# Patient Record
Sex: Female | Born: 1935 | Race: White | Hispanic: No | Smoking: Never smoker
Health system: Southern US, Community
[De-identification: ages and names within clinical notes are randomized; demographics above are authoritative.]

## PROBLEM LIST (undated history)

## (undated) DIAGNOSIS — T4145XA Adverse effect of unspecified anesthetic, initial encounter: Secondary | ICD-10-CM

## (undated) DIAGNOSIS — N3090 Cystitis, unspecified without hematuria: Secondary | ICD-10-CM

## (undated) DIAGNOSIS — T8859XA Other complications of anesthesia, initial encounter: Secondary | ICD-10-CM

## (undated) DIAGNOSIS — I10 Essential (primary) hypertension: Secondary | ICD-10-CM

## (undated) DIAGNOSIS — J189 Pneumonia, unspecified organism: Secondary | ICD-10-CM

## (undated) HISTORY — PX: BREAST BIOPSY: SHX20

## (undated) HISTORY — PX: ABDOMINAL HYSTERECTOMY: SHX81

## (undated) HISTORY — PX: TONSILLECTOMY: SUR1361

## (undated) HISTORY — PX: APPENDECTOMY: SHX54

---

## 2017-07-09 ENCOUNTER — Encounter (HOSPITAL_COMMUNITY): Payer: Self-pay

## 2017-07-09 ENCOUNTER — Emergency Department (HOSPITAL_COMMUNITY): Payer: Medicare Other

## 2017-07-09 ENCOUNTER — Other Ambulatory Visit: Payer: Self-pay

## 2017-07-09 ENCOUNTER — Inpatient Hospital Stay (HOSPITAL_COMMUNITY)
Admission: EM | Admit: 2017-07-09 | Discharge: 2017-07-19 | DRG: 956 | Disposition: A | Discharge status: Skilled Nursing Facility | Payer: Medicare Other | Attending: General Surgery | Admitting: General Surgery

## 2017-07-09 DIAGNOSIS — R079 Chest pain, unspecified: Secondary | ICD-10-CM

## 2017-07-09 DIAGNOSIS — I2699 Other pulmonary embolism without acute cor pulmonale: Secondary | ICD-10-CM | POA: Diagnosis not present

## 2017-07-09 DIAGNOSIS — S065X9A Traumatic subdural hemorrhage with loss of consciousness of unspecified duration, initial encounter: Principal | ICD-10-CM | POA: Diagnosis present

## 2017-07-09 DIAGNOSIS — Z9181 History of falling: Secondary | ICD-10-CM

## 2017-07-09 DIAGNOSIS — I10 Essential (primary) hypertension: Secondary | ICD-10-CM | POA: Diagnosis present

## 2017-07-09 DIAGNOSIS — K5900 Constipation, unspecified: Secondary | ICD-10-CM | POA: Diagnosis present

## 2017-07-09 DIAGNOSIS — D62 Acute posthemorrhagic anemia: Secondary | ICD-10-CM | POA: Diagnosis not present

## 2017-07-09 DIAGNOSIS — R296 Repeated falls: Secondary | ICD-10-CM | POA: Diagnosis present

## 2017-07-09 DIAGNOSIS — S72141A Displaced intertrochanteric fracture of right femur, initial encounter for closed fracture: Secondary | ICD-10-CM | POA: Diagnosis not present

## 2017-07-09 DIAGNOSIS — M25571 Pain in right ankle and joints of right foot: Secondary | ICD-10-CM | POA: Diagnosis not present

## 2017-07-09 DIAGNOSIS — G8929 Other chronic pain: Secondary | ICD-10-CM | POA: Diagnosis present

## 2017-07-09 DIAGNOSIS — R0602 Shortness of breath: Secondary | ICD-10-CM

## 2017-07-09 DIAGNOSIS — M810 Age-related osteoporosis without current pathological fracture: Secondary | ICD-10-CM | POA: Diagnosis not present

## 2017-07-09 DIAGNOSIS — Z79899 Other long term (current) drug therapy: Secondary | ICD-10-CM | POA: Diagnosis not present

## 2017-07-09 DIAGNOSIS — S42221A 2-part displaced fracture of surgical neck of right humerus, initial encounter for closed fracture: Secondary | ICD-10-CM | POA: Diagnosis not present

## 2017-07-09 DIAGNOSIS — S72001A Fracture of unspecified part of neck of right femur, initial encounter for closed fracture: Secondary | ICD-10-CM

## 2017-07-09 DIAGNOSIS — W1830XA Fall on same level, unspecified, initial encounter: Secondary | ICD-10-CM | POA: Diagnosis present

## 2017-07-09 DIAGNOSIS — W19XXXA Unspecified fall, initial encounter: Secondary | ICD-10-CM | POA: Diagnosis present

## 2017-07-09 DIAGNOSIS — Y92009 Unspecified place in unspecified non-institutional (private) residence as the place of occurrence of the external cause: Secondary | ICD-10-CM

## 2017-07-09 DIAGNOSIS — M4856XA Collapsed vertebra, not elsewhere classified, lumbar region, initial encounter for fracture: Secondary | ICD-10-CM | POA: Diagnosis not present

## 2017-07-09 DIAGNOSIS — M4854XA Collapsed vertebra, not elsewhere classified, thoracic region, initial encounter for fracture: Secondary | ICD-10-CM | POA: Diagnosis not present

## 2017-07-09 DIAGNOSIS — S42211A Unspecified displaced fracture of surgical neck of right humerus, initial encounter for closed fracture: Secondary | ICD-10-CM

## 2017-07-09 DIAGNOSIS — F0390 Unspecified dementia without behavioral disturbance: Secondary | ICD-10-CM | POA: Diagnosis present

## 2017-07-09 DIAGNOSIS — W19XXXS Unspecified fall, sequela: Secondary | ICD-10-CM

## 2017-07-09 DIAGNOSIS — Z419 Encounter for procedure for purposes other than remedying health state, unspecified: Secondary | ICD-10-CM

## 2017-07-09 DIAGNOSIS — M25561 Pain in right knee: Secondary | ICD-10-CM | POA: Diagnosis not present

## 2017-07-09 DIAGNOSIS — R40241 Glasgow coma scale score 13-15, unspecified time: Secondary | ICD-10-CM | POA: Diagnosis present

## 2017-07-09 DIAGNOSIS — S065XAA Traumatic subdural hemorrhage with loss of consciousness status unknown, initial encounter: Secondary | ICD-10-CM

## 2017-07-09 DIAGNOSIS — M25461 Effusion, right knee: Secondary | ICD-10-CM

## 2017-07-09 DIAGNOSIS — E440 Moderate protein-calorie malnutrition: Secondary | ICD-10-CM

## 2017-07-09 HISTORY — DX: Adverse effect of unspecified anesthetic, initial encounter: T41.45XA

## 2017-07-09 HISTORY — DX: Essential (primary) hypertension: I10

## 2017-07-09 HISTORY — DX: Pneumonia, unspecified organism: J18.9

## 2017-07-09 HISTORY — DX: Other complications of anesthesia, initial encounter: T88.59XA

## 2017-07-09 HISTORY — DX: Cystitis, unspecified without hematuria: N30.90

## 2017-07-09 LAB — I-STAT CHEM 8, ED
BUN: 19 mg/dL (ref 6–20)
CALCIUM ION: 1.12 mmol/L — AB (ref 1.15–1.40)
CHLORIDE: 102 mmol/L (ref 101–111)
Creatinine, Ser: 0.5 mg/dL (ref 0.44–1.00)
Glucose, Bld: 156 mg/dL — ABNORMAL HIGH (ref 65–99)
HCT: 41 % (ref 36.0–46.0)
Hemoglobin: 13.9 g/dL (ref 12.0–15.0)
POTASSIUM: 3.2 mmol/L — AB (ref 3.5–5.1)
SODIUM: 140 mmol/L (ref 135–145)
TCO2: 27 mmol/L (ref 22–32)

## 2017-07-09 LAB — CBC WITH DIFFERENTIAL/PLATELET
Basophils Absolute: 0 10*3/uL (ref 0.0–0.1)
Basophils Relative: 0 %
Eosinophils Absolute: 0.1 10*3/uL (ref 0.0–0.7)
Eosinophils Relative: 1 %
HCT: 39.5 % (ref 36.0–46.0)
HEMOGLOBIN: 13.1 g/dL (ref 12.0–15.0)
LYMPHS ABS: 1.7 10*3/uL (ref 0.7–4.0)
Lymphocytes Relative: 16 %
MCH: 30.3 pg (ref 26.0–34.0)
MCHC: 33.2 g/dL (ref 30.0–36.0)
MCV: 91.2 fL (ref 78.0–100.0)
Monocytes Absolute: 0.9 10*3/uL (ref 0.1–1.0)
Monocytes Relative: 8 %
NEUTROS ABS: 7.8 10*3/uL — AB (ref 1.7–7.7)
NEUTROS PCT: 75 %
Platelets: 333 10*3/uL (ref 150–400)
RBC: 4.33 MIL/uL (ref 3.87–5.11)
RDW: 13.6 % (ref 11.5–15.5)
WBC: 10.5 10*3/uL (ref 4.0–10.5)

## 2017-07-09 LAB — COMPREHENSIVE METABOLIC PANEL
ALK PHOS: 73 U/L (ref 38–126)
ALT: 18 U/L (ref 14–54)
AST: 22 U/L (ref 15–41)
Albumin: 3.5 g/dL (ref 3.5–5.0)
Anion gap: 10 (ref 5–15)
BUN: 17 mg/dL (ref 6–20)
CALCIUM: 8.7 mg/dL — AB (ref 8.9–10.3)
CO2: 25 mmol/L (ref 22–32)
CREATININE: 0.53 mg/dL (ref 0.44–1.00)
Chloride: 102 mmol/L (ref 101–111)
Glucose, Bld: 156 mg/dL — ABNORMAL HIGH (ref 65–99)
Potassium: 3.1 mmol/L — ABNORMAL LOW (ref 3.5–5.1)
Sodium: 137 mmol/L (ref 135–145)
Total Bilirubin: 0.6 mg/dL (ref 0.3–1.2)
Total Protein: 6.4 g/dL — ABNORMAL LOW (ref 6.5–8.1)

## 2017-07-09 LAB — CK: CK TOTAL: 47 U/L (ref 38–234)

## 2017-07-09 LAB — ABO/RH: ABO/RH(D): A POS

## 2017-07-09 LAB — URINALYSIS, ROUTINE W REFLEX MICROSCOPIC
BILIRUBIN URINE: NEGATIVE
GLUCOSE, UA: NEGATIVE mg/dL
KETONES UR: NEGATIVE mg/dL
LEUKOCYTES UA: NEGATIVE
NITRITE: NEGATIVE
PH: 7 (ref 5.0–8.0)
PROTEIN: NEGATIVE mg/dL
Specific Gravity, Urine: 1.014 (ref 1.005–1.030)

## 2017-07-09 LAB — PROTIME-INR
INR: 0.96
PROTHROMBIN TIME: 12.6 s (ref 11.4–15.2)

## 2017-07-09 LAB — TYPE AND SCREEN
ABO/RH(D): A POS
Antibody Screen: NEGATIVE

## 2017-07-09 LAB — I-STAT CG4 LACTIC ACID, ED: Lactic Acid, Venous: 1.52 mmol/L (ref 0.5–1.9)

## 2017-07-09 LAB — CDS SEROLOGY

## 2017-07-09 LAB — ETHANOL

## 2017-07-09 MED ORDER — MAGNESIUM HYDROXIDE 400 MG/5ML PO SUSP
30.0000 mL | Freq: Once | ORAL | Status: DC
  Filled 2017-07-09: qty 30

## 2017-07-09 MED ORDER — AMLODIPINE BESYLATE 5 MG PO TABS
7.5000 mg | ORAL_TABLET | Freq: Every day | ORAL | Status: DC
  Administered 2017-07-10 – 2017-07-19 (×9): 7.5 mg via ORAL
  Filled 2017-07-09 (×9): qty 1

## 2017-07-09 MED ORDER — DOCUSATE SODIUM 100 MG PO CAPS
200.0000 mg | ORAL_CAPSULE | Freq: Two times a day (BID) | ORAL | Status: DC
  Administered 2017-07-09 – 2017-07-19 (×19): 200 mg via ORAL
  Filled 2017-07-09 (×21): qty 2

## 2017-07-09 MED ORDER — ONDANSETRON HCL 4 MG/2ML IJ SOLN
4.0000 mg | Freq: Once | INTRAMUSCULAR | Status: AC
  Administered 2017-07-09: 4 mg via INTRAVENOUS
  Filled 2017-07-09: qty 2

## 2017-07-09 MED ORDER — ACETAMINOPHEN 325 MG PO TABS
650.0000 mg | ORAL_TABLET | ORAL | Status: DC | PRN
  Administered 2017-07-09 – 2017-07-11 (×3): 650 mg via ORAL
  Filled 2017-07-09 (×3): qty 2

## 2017-07-09 MED ORDER — POLYETHYLENE GLYCOL 3350 17 G PO PACK
17.0000 g | PACK | Freq: Every day | ORAL | Status: DC
  Administered 2017-07-10 – 2017-07-19 (×9): 17 g via ORAL
  Filled 2017-07-09 (×9): qty 1

## 2017-07-09 MED ORDER — FLEET ENEMA 7-19 GM/118ML RE ENEM
1.0000 | ENEMA | Freq: Once | RECTAL | Status: DC
  Filled 2017-07-09: qty 1

## 2017-07-09 MED ORDER — HYDROMORPHONE HCL 1 MG/ML IJ SOLN
0.3000 mg | INTRAMUSCULAR | Status: DC | PRN
  Administered 2017-07-09 – 2017-07-12 (×15): 0.3 mg via INTRAVENOUS
  Filled 2017-07-09 (×15): qty 1

## 2017-07-09 MED ORDER — LACTATED RINGERS IV SOLN
INTRAVENOUS | Status: DC
  Administered 2017-07-09 – 2017-07-13 (×8): via INTRAVENOUS
  Administered 2017-07-14 (×2): 1000 mL via INTRAVENOUS
  Administered 2017-07-15 – 2017-07-17 (×3): via INTRAVENOUS

## 2017-07-09 MED ORDER — TRAMADOL HCL 50 MG PO TABS
50.0000 mg | ORAL_TABLET | Freq: Two times a day (BID) | ORAL | Status: DC
  Administered 2017-07-09 – 2017-07-18 (×19): 50 mg via ORAL
  Filled 2017-07-09 (×19): qty 1

## 2017-07-09 MED ORDER — FENTANYL CITRATE (PF) 100 MCG/2ML IJ SOLN
50.0000 ug | Freq: Once | INTRAMUSCULAR | Status: AC
  Administered 2017-07-09: 50 ug via INTRAVENOUS
  Filled 2017-07-09: qty 2

## 2017-07-09 MED ORDER — ONDANSETRON HCL 4 MG/2ML IJ SOLN
4.0000 mg | Freq: Four times a day (QID) | INTRAMUSCULAR | Status: DC | PRN
  Administered 2017-07-10 – 2017-07-14 (×3): 4 mg via INTRAVENOUS
  Filled 2017-07-09 (×11): qty 2

## 2017-07-09 MED ORDER — IOPAMIDOL (ISOVUE-300) INJECTION 61%
INTRAVENOUS | Status: AC
  Administered 2017-07-09: 50 mL
  Filled 2017-07-09: qty 100

## 2017-07-09 MED ORDER — ONDANSETRON HCL 4 MG/2ML IJ SOLN
4.0000 mg | Freq: Once | INTRAMUSCULAR | Status: AC
Start: 2017-07-09 — End: 2017-07-09
  Administered 2017-07-09: 4 mg via INTRAVENOUS
  Filled 2017-07-09 (×2): qty 2

## 2017-07-09 MED ORDER — ONDANSETRON 4 MG PO TBDP
4.0000 mg | ORAL_TABLET | Freq: Four times a day (QID) | ORAL | Status: DC | PRN
Start: 2017-07-09 — End: 2017-07-19
  Administered 2017-07-15 – 2017-07-17 (×2): 4 mg via ORAL
  Filled 2017-07-09 (×3): qty 1

## 2017-07-09 NOTE — Consult Note (Addendum)
Patient ID: Misty [name] MRN: [id number] DOB/AGE: 80/08/1934 80 y.o.  Admit date: 07/09/2017  Admission Diagnoses:  Active Problems:   Fall from standing   HPI: Patient being seen at the request of trauma service for right proximal humerus fracture and right hip IT fracture. Patient states that she fell late last night and suffered multiple injuries. She has history of multiple falls. Imaging studies also show audible thoracic and lumbar compression fractures that were felt to be chronic. Currently complaining of right shoulder and right hip pain. CT head showed a right subdural hematoma measuring about 2 mm. She has been seen by Dr. Yetta BarreJones with neurosurgery. Family members are not present during my visit.  Past Medical History: Past Medical History:  Diagnosis Date  . Hypertension     Surgical History: History reviewed. No pertinent surgical history.  Family History: History reviewed. No pertinent family history.  Social History: Social History   Socioeconomic History  . Marital status: Widowed    Spouse name: Not on file  . Number of children: Not on file  . Years of education: Not on file  . Highest education level: Not on file  Social Needs  . Financial resource strain: Not on file  . Food insecurity - worry: Not on file  . Food insecurity - inability: Not on file  . Transportation needs - medical: Not on file  . Transportation needs - non-medical: Not on file  Occupational History  . Not on file  Tobacco Use  . Smoking status: Not on file  Substance and Sexual Activity  . Alcohol use: Not on file  . Drug use: Not on file  . Sexual activity: Not on file  Other Topics Concern  . Not on file  Social History Narrative  . Not on file    Allergies: Darvon [propoxyphene]; Hydrocodone; Lactose; Other; Sulfa antibiotics; and Valsartan  Medications: I have reviewed the patient's current medications.  Vital Signs: Patient Vitals for the past 24 hrs:  BP  Temp Temp src Pulse Resp SpO2  07/09/17 0838 (!) 162/80 98.2 F (36.8 C) Oral 94 18 91 %  07/09/17 0745 (!) 155/88 - - 93 18 100 %  07/09/17 0715 (!) 146/88 - - 100 (!) 22 100 %  07/09/17 0645 (!) 155/82 - - 86 20 100 %  07/09/17 0630 (!) 158/84 - - 87 20 100 %  07/09/17 0530 (!) 159/83 - - 89 16 94 %  07/09/17 0500 (!) 170/88 - - 98 18 100 %  07/09/17 0430 (!) 163/84 - - 87 16 100 %  07/09/17 0400 (!) 177/91 - - 96 - 97 %  07/09/17 0330 (!) 168/89 - - 94 - 95 %  07/09/17 0300 (!) 170/93 - - (!) 101 18 100 %  07/09/17 0215 (!) 166/85 - - 88 18 100 %  07/09/17 0201 (!) 165/81 98.2 F (36.8 C) Oral 89 18 97 %  07/09/17 0155 - - - - - 98 %    Radiology: Dg Chest 1 View  Result Date: 07/09/2017 CLINICAL DATA:  Initial evaluation for acute trauma, fall. EXAM: CHEST 1 VIEW COMPARISON:  Prior CT from earlier the same day. FINDINGS: Mild cardiomegaly. Mediastinal silhouette normal. Aortic atherosclerosis. Lungs normally inflated. No focal infiltrates. No pulmonary edema or appreciable pleural effusion. No pneumothorax. Acute fracture through the right humeral neck with impaction. No other acute osseous abnormality. IMPRESSION: 1. Acute fracture through the right humeral neck with impaction. 2. No other acute cardiopulmonary abnormality within  the chest. 3. Mild cardiomegaly with aortic atherosclerosis. Electronically Signed   By: Rise MuBenjamin  McClintock M.D.   On: 07/09/2017 04:35   Dg Shoulder Right  Result Date: 07/09/2017 CLINICAL DATA:  Initial evaluation for acute trauma, fall. EXAM: RIGHT SHOULDER - 2+ VIEW COMPARISON:  Prior CT from earlier same day. FINDINGS: There is an acute transverse fracture through the surgical neck of the right humerus with slight impaction. Humeral head is rotated and slightly displaced laterally. Glenoid intact. Clavicle intact. Mild soft tissue swelling. Visualize right hemithorax is clear. IMPRESSION: Acute transverse fracture through the right humeral neck with  impaction. Electronically Signed   By: Rise MuBenjamin  McClintock M.D.   On: 07/09/2017 04:37   Ct Head Wo Contrast  Result Date: 07/09/2017 CLINICAL DATA:  Unwitnessed fall at home. EXAM: CT HEAD WITHOUT CONTRAST CT CERVICAL SPINE WITHOUT CONTRAST TECHNIQUE: Multidetector CT imaging of the head and cervical spine was performed following the standard protocol without intravenous contrast. Multiplanar CT image reconstructions of the cervical spine were also generated. COMPARISON:  None. FINDINGS: CT HEAD FINDINGS Brain: Trace dependent hyperdensity in the posterior oral left lateral ventricle suspicious for small amount of intraventricular hemorrhage. Similar hyperdensity in the right appears contiguous with the choroid plexus may be additional tiny site of hemorrhage or related to the choroid. Ventricular dilatation likely secondary to atrophy. Thin right subdural hematoma measures up to 2 mm in thickness. No midline shift. Moderate periventricular white matter changes consistent with chronic small vessel ischemia. No evidence of acute ischemia. Vascular: Atherosclerosis of skullbase vasculature without hyperdense vessel or abnormal calcification. Skull: No fracture or focal lesion. Sinuses/Orbits: Paranasal sinuses and mastoid air cells are clear. The visualized orbits are unremarkable. Bilateral cataract resection. Other: Right frontal scalp hematoma. CT CERVICAL SPINE FINDINGS Alignment: Exaggerated cervical lordosis.  No traumatic subluxation. Skull base and vertebrae: The dens and skull base are intact. Cervical vertebral body heights are preserved. Mild loss of height of T1 vertebral body, moderate to severe T2 vertebral body, and mild T3 vertebral body. Degenerative pannus at the atlanto dens interval. Bones are under mineralized. Soft tissues and spinal canal: No prevertebral fluid or swelling. No visible canal hematoma. Disc levels: Disc space narrowing at C6-C7. Mild multilevel endplate spurring. Upper  chest: Assessed on concurrent chest CT. Calcified granuloma at the left lung apex. Other: Carotid calcifications. IMPRESSION: 1. Thin acute right subdural hematoma measuring 2 mm. No midline shift. Small amount of intraventricular blood in the left and right lateral ventricle. 2. Right frontal scalp hematoma without skull fracture. 3. Generally cerebral atrophy. Moderate chronic small vessel ischemia. 4. No acute fracture or subluxation of the cervical spine. 5. Compression fractures of upper thoracic vertebra, characterized on concurrent chest CT. Critical Value/emergent results were called by telephone at the time of interpretation on 07/09/2017 at 4:02 am to Dr. Glynn OctaveSTEPHEN RANCOUR , who verbally acknowledged these results. Electronically Signed   By: Rubye OaksMelanie  Ehinger M.D.   On: 07/09/2017 04:02   Ct Chest W Contrast  Result Date: 07/09/2017 CLINICAL DATA:  Initial evaluation for acute trauma, unwitnessed fall. EXAM: CT CHEST, ABDOMEN, AND PELVIS WITH CONTRAST TECHNIQUE: Multidetector CT imaging of the chest, abdomen and pelvis was performed following the standard protocol during bolus administration of intravenous contrast. CONTRAST:  50mL ISOVUE-300 IOPAMIDOL (ISOVUE-300) INJECTION 61% COMPARISON:  None. FINDINGS: CT CHEST FINDINGS Cardiovascular: Intrathoracic aorta of normal caliber without aneurysm or other acute abnormality. Extensive atheromatous plaque throughout the arch and descending intrathoracic aorta. Partially visualized great vessels are tortuous but  within normal limits. Mild cardiomegaly noted. Diffuse 3 vessel coronary artery calcifications. No pericardial effusion. Limited evaluation of the pulmonary arterial tree grossly unremarkable. Mediastinum/Nodes: Thyroid normal. No pathologically enlarged mediastinal, hilar, or axillary lymph nodes. Esophagus within normal limits. Lungs/Pleura: Tracheobronchial tree intact and patent. Small layering bilateral pleural effusions with associated  atelectasis. Lungs are otherwise clear. No focal infiltrates. No pulmonary edema. No pneumothorax. No worrisome pulmonary nodule or mass. Musculoskeletal: There are compression deformities involving the T2, T3, T6, T7, T8, and T9 vertebral bodies, favored to be chronic in nature. Exaggeration of the normal thoracic kyphosis. No acute rib fracture. There is an acute fracture through the neck of the right humerus with impaction. Adjacent soft tissue swelling. No discrete lytic or blastic osseous lesions. CT ABDOMEN PELVIS FINDINGS Hepatobiliary: Subcentimeter hypodensity within left hepatic lobe noted, too small the characterize, but of doubtful significance. Liver otherwise unremarkable. Gallbladder surgically absent. Mild prominence of common bile duct like related age and post cholecystectomy changes. Pancreas: Pancreas within normal limits. Spleen: Spleen within normal limits. Adrenals/Urinary Tract: Adrenal glands are normal. Kidneys equal in size with symmetric enhancement. Small right renal cyst noted. No nephrolithiasis or hydronephrosis. No focal enhancing renal mass. No appreciable hydroureter. Bladder moderately distended without acute abnormality. Stomach/Bowel: Stomach within normal limits. No evidence for bowel obstruction or acute bowel injury. Large volume stool impacted within the rectal vault, consistent with constipation. No acute inflammatory changes seen about the bowels. Vascular/Lymphatic: Extensive atherosclerosis throughout the intra-abdominal aorta and its branch vessels. No aneurysm. Mesenteric vessels are patent proximally. No adenopathy. Reproductive: Uterus appears to be absent. Ovaries not discretely identified. Other: No free air or fluid. Musculoskeletal: External soft tissues within normal limits. There is an acute fracture through the right femoral neck. Bony pelvis otherwise intact. Compression deformity seen involving the L1 through L5 vertebral bodies, favored to be chronic.  Sequelae of prior vertebral augmentation present at L3. No discrete lytic or blastic osseous lesions. IMPRESSION: 1. Acute fractures involving the right humeral neck and intertrochanteric left femur. 2. No other acute traumatic injury within the chest, abdomen, and pelvis. 3. Multiple compression deformities throughout the thoracolumbar spine as above, favored to be chronic in nature. Correlation physical exam recommended. 4. Large volume stool within the colon, consistent with constipation. 5. Small layering bilateral pleural effusions with associated atelectasis. 6. Extensive atherosclerosis. Electronically Signed   By: Rise Mu M.D.   On: 07/09/2017 04:15   Ct Cervical Spine Wo Contrast  Result Date: 07/09/2017 CLINICAL DATA:  Unwitnessed fall at home. EXAM: CT HEAD WITHOUT CONTRAST CT CERVICAL SPINE WITHOUT CONTRAST TECHNIQUE: Multidetector CT imaging of the head and cervical spine was performed following the standard protocol without intravenous contrast. Multiplanar CT image reconstructions of the cervical spine were also generated. COMPARISON:  None. FINDINGS: CT HEAD FINDINGS Brain: Trace dependent hyperdensity in the posterior oral left lateral ventricle suspicious for small amount of intraventricular hemorrhage. Similar hyperdensity in the right appears contiguous with the choroid plexus may be additional tiny site of hemorrhage or related to the choroid. Ventricular dilatation likely secondary to atrophy. Thin right subdural hematoma measures up to 2 mm in thickness. No midline shift. Moderate periventricular white matter changes consistent with chronic small vessel ischemia. No evidence of acute ischemia. Vascular: Atherosclerosis of skullbase vasculature without hyperdense vessel or abnormal calcification. Skull: No fracture or focal lesion. Sinuses/Orbits: Paranasal sinuses and mastoid air cells are clear. The visualized orbits are unremarkable. Bilateral cataract resection. Other:  Right frontal scalp hematoma. CT CERVICAL  SPINE FINDINGS Alignment: Exaggerated cervical lordosis.  No traumatic subluxation. Skull base and vertebrae: The dens and skull base are intact. Cervical vertebral body heights are preserved. Mild loss of height of T1 vertebral body, moderate to severe T2 vertebral body, and mild T3 vertebral body. Degenerative pannus at the atlanto dens interval. Bones are under mineralized. Soft tissues and spinal canal: No prevertebral fluid or swelling. No visible canal hematoma. Disc levels: Disc space narrowing at C6-C7. Mild multilevel endplate spurring. Upper chest: Assessed on concurrent chest CT. Calcified granuloma at the left lung apex. Other: Carotid calcifications. IMPRESSION: 1. Thin acute right subdural hematoma measuring 2 mm. No midline shift. Small amount of intraventricular blood in the left and right lateral ventricle. 2. Right frontal scalp hematoma without skull fracture. 3. Generally cerebral atrophy. Moderate chronic small vessel ischemia. 4. No acute fracture or subluxation of the cervical spine. 5. Compression fractures of upper thoracic vertebra, characterized on concurrent chest CT. Critical Value/emergent results were called by telephone at the time of interpretation on 07/09/2017 at 4:02 am to Dr. Glynn OctaveSTEPHEN RANCOUR , who verbally acknowledged these results. Electronically Signed   By: Rubye OaksMelanie  Ehinger M.D.   On: 07/09/2017 04:02   Ct Abdomen Pelvis W Contrast  Result Date: 07/09/2017 CLINICAL DATA:  Initial evaluation for acute trauma, unwitnessed fall. EXAM: CT CHEST, ABDOMEN, AND PELVIS WITH CONTRAST TECHNIQUE: Multidetector CT imaging of the chest, abdomen and pelvis was performed following the standard protocol during bolus administration of intravenous contrast. CONTRAST:  50mL ISOVUE-300 IOPAMIDOL (ISOVUE-300) INJECTION 61% COMPARISON:  None. FINDINGS: CT CHEST FINDINGS Cardiovascular: Intrathoracic aorta of normal caliber without aneurysm or other  acute abnormality. Extensive atheromatous plaque throughout the arch and descending intrathoracic aorta. Partially visualized great vessels are tortuous but within normal limits. Mild cardiomegaly noted. Diffuse 3 vessel coronary artery calcifications. No pericardial effusion. Limited evaluation of the pulmonary arterial tree grossly unremarkable. Mediastinum/Nodes: Thyroid normal. No pathologically enlarged mediastinal, hilar, or axillary lymph nodes. Esophagus within normal limits. Lungs/Pleura: Tracheobronchial tree intact and patent. Small layering bilateral pleural effusions with associated atelectasis. Lungs are otherwise clear. No focal infiltrates. No pulmonary edema. No pneumothorax. No worrisome pulmonary nodule or mass. Musculoskeletal: There are compression deformities involving the T2, T3, T6, T7, T8, and T9 vertebral bodies, favored to be chronic in nature. Exaggeration of the normal thoracic kyphosis. No acute rib fracture. There is an acute fracture through the neck of the right humerus with impaction. Adjacent soft tissue swelling. No discrete lytic or blastic osseous lesions. CT ABDOMEN PELVIS FINDINGS Hepatobiliary: Subcentimeter hypodensity within left hepatic lobe noted, too small the characterize, but of doubtful significance. Liver otherwise unremarkable. Gallbladder surgically absent. Mild prominence of common bile duct like related age and post cholecystectomy changes. Pancreas: Pancreas within normal limits. Spleen: Spleen within normal limits. Adrenals/Urinary Tract: Adrenal glands are normal. Kidneys equal in size with symmetric enhancement. Small right renal cyst noted. No nephrolithiasis or hydronephrosis. No focal enhancing renal mass. No appreciable hydroureter. Bladder moderately distended without acute abnormality. Stomach/Bowel: Stomach within normal limits. No evidence for bowel obstruction or acute bowel injury. Large volume stool impacted within the rectal vault, consistent with  constipation. No acute inflammatory changes seen about the bowels. Vascular/Lymphatic: Extensive atherosclerosis throughout the intra-abdominal aorta and its branch vessels. No aneurysm. Mesenteric vessels are patent proximally. No adenopathy. Reproductive: Uterus appears to be absent. Ovaries not discretely identified. Other: No free air or fluid. Musculoskeletal: External soft tissues within normal limits. There is an acute fracture through the right femoral  neck. Bony pelvis otherwise intact. Compression deformity seen involving the L1 through L5 vertebral bodies, favored to be chronic. Sequelae of prior vertebral augmentation present at L3. No discrete lytic or blastic osseous lesions. IMPRESSION: 1. Acute fractures involving the right humeral neck and intertrochanteric left femur. 2. No other acute traumatic injury within the chest, abdomen, and pelvis. 3. Multiple compression deformities throughout the thoracolumbar spine as above, favored to be chronic in nature. Correlation physical exam recommended. 4. Large volume stool within the colon, consistent with constipation. 5. Small layering bilateral pleural effusions with associated atelectasis. 6. Extensive atherosclerosis. Electronically Signed   By: Rise MuBenjamin  McClintock M.D.   On: 07/09/2017 04:15   Dg Hip Unilat With Pelvis 2-3 Views Right  Result Date: 07/09/2017 CLINICAL DATA:  Initial evaluation for acute trauma, fall. EXAM: DG HIP (WITH OR WITHOUT PELVIS) 2-3V RIGHT COMPARISON:  Prior CT from earlier same day. FINDINGS: Acute nondisplaced intertrochanteric fracture of the right femur. Right femoral head remains normally aligned within the acetabulum. Femoral head height preserved. Bony pelvis intact. SI joints approximated. No acute osseus abnormality about the left hip. IV contrast material present within the bladder. Sequelae of prior vertebral augmentation present within the lower lumbar spine. IMPRESSION: Acute nondisplaced intertrochanteric  fracture of the right femur. Electronically Signed   By: Rise MuBenjamin  McClintock M.D.   On: 07/09/2017 04:36    Labs: Recent Labs    07/09/17 0212 07/09/17 0216  WBC 10.5  --   RBC 4.33  --   HCT 39.5 41.0  PLT 333  --    Recent Labs    07/09/17 0212 07/09/17 0216  NA 137 140  K 3.1* 3.2*  CL 102 102  CO2 25  --   BUN 17 19  CREATININE 0.53 0.50  GLUCOSE 156* 156*  CALCIUM 8.7*  --    Recent Labs    07/09/17 0212  INR 0.96    Review of Systems: Review of Systems  Respiratory: Negative.   Cardiovascular: Negative.   Musculoskeletal: Positive for falls and joint pain.  Neurological:       Head pain secondary to trauma with  forehead hematoma  Psychiatric/Behavioral: Positive for memory loss.       Possible history of dementia    Physical Exam: Pleasant elderly white female. Family members are present during my visit. Some questions patient appears to answer appropriately. I'm not sure of her baseline. Right shoulder sling on along with right lower extremity Buck's traction. She is neurovascularly intact. Right shoulder and hip pain with slight movement. No respiratory distress. Bilateral calves are nontender.  Assessment and Plan: Right proximal humerus fracture Right hip IT fracture Right subdural hematoma  Dr. Ophelia CharterYates and I reviewed patient imaging studies. Will order CT right shoulder. Plan would be ORIF right hip when she is cleared from neurosurgery and trauma service.  We'll make decisions regarding right shoulder after we have reviewed the CT scan.  Continue right shoulder sling and Buck's traction.   Genene ChurnJames M. Barry Dieneswens PA-C For Annell GreeningMark Brittain Hosie MD [facility]                 Addendum by Ophelia CharterYates. Talked with patients son who has POA. Will plan on hip fixation trochanteric nail on Wednesday afternoon. Awaiting CT of shoulder but probable closed Tx of that injury . Will post for Surgery Wednesday afternoon and proceed unless status changes.  My cell [phone]  complex problem with head bleed, 2 extremity injuries problems make sure at increased operative  risk which was discussed with patient as well as son.  Risk for further head bleed discussed with anesthesia.

## 2017-07-09 NOTE — ED Notes (Signed)
ED Provider at bedside. 

## 2017-07-09 NOTE — ED Notes (Addendum)
Attempted to call report

## 2017-07-09 NOTE — Progress Notes (Signed)
Orthopedic Tech Progress Note Patient Details:  Misty [name] 05/19/1935 [id number]  Ortho Devices Type of Ortho Device: Arm sling Ortho Device/Splint Location: rue Ortho Device/Splint Interventions: Ordered, Application, Adjustment   Post Interventions Patient Tolerated: Well Instructions Provided: Care of device   [name], Jahlil Ziller J 07/09/2017, 7:31 AM

## 2017-07-09 NOTE — H&P (Signed)
Activation and Reason: Fall from standing, trauma consult  Primary Survey:  Airway: Intact Breathing: Spontaneously, BS bilaterally Circulation: Palpable pulses in all 4 ext Disability: GCS 15  Misty [name] is an 82 y.o. female.  HPI: Misty [name] is an 82yo female in the ED following a fall from standing. Was in her bedroom at home with her son whom she lives with - fell in the middle of the night. EMS was called. Nonambulatory on scene. She was transported here. Complains of pain in her left arm, left hip and left forehead. Movement makes pain worse. Pain does not radiate. Nothing makes pain better. Pain is sharp. Denies pain in her chest, abdomen, left sided extremities. Unsure if she had LOC.   Past Medical History:  Diagnosis Date  . Hypertension     Social History:  has no tobacco, alcohol, and drug history on file. She has custody of her great grandchildren and is their primary caregiver.  Allergies:  Allergies  Allergen Reactions  . Darvon [Propoxyphene] Other (See Comments)    Unknown allergic reaction  . Hydrocodone Nausea And Vomiting  . Lactose Other (See Comments)    Unknown allergic reaction  . Other Other (See Comments)    Unknown allergic reaction to sodium pentothal  . Sulfa Antibiotics Other (See Comments)    Unknown allergic reaction  . Valsartan Other (See Comments)    Unknown allergic reaction - reported by [facility] medical 05/12/17    Medications: I have reviewed the patient's current medications.  Results for orders placed or performed during the hospital encounter of 07/09/17 (from the past 48 hour(s))  CBC with Differential/Platelet     Status: Abnormal   Collection Time: 07/09/17  2:12 AM  Result Value Ref Range   WBC 10.5 4.0 - 10.5 K/uL   RBC 4.33 3.87 - 5.11 MIL/uL   Hemoglobin 13.1 12.0 - 15.0 g/dL   HCT 39.5 36.0 - 46.0 %   MCV 91.2 78.0 - 100.0 fL   MCH 30.3 26.0 - 34.0 pg   MCHC 33.2 30.0 - 36.0 g/dL   RDW 13.6 11.5 - 15.5 %   Platelets 333 150 - 400 K/uL   Neutrophils Relative % 75 %   Neutro Abs 7.8 (H) 1.7 - 7.7 K/uL   Lymphocytes Relative 16 %   Lymphs Abs 1.7 0.7 - 4.0 K/uL   Monocytes Relative 8 %   Monocytes Absolute 0.9 0.1 - 1.0 K/uL   Eosinophils Relative 1 %   Eosinophils Absolute 0.1 0.0 - 0.7 K/uL   Basophils Relative 0 %   Basophils Absolute 0.0 0.0 - 0.1 K/uL  Comprehensive metabolic panel     Status: Abnormal   Collection Time: 07/09/17  2:12 AM  Result Value Ref Range   Sodium 137 135 - 145 mmol/L   Potassium 3.1 (L) 3.5 - 5.1 mmol/L   Chloride 102 101 - 111 mmol/L   CO2 25 22 - 32 mmol/L   Glucose, Bld 156 (H) 65 - 99 mg/dL   BUN 17 6 - 20 mg/dL   Creatinine, Ser 0.53 0.44 - 1.00 mg/dL   Calcium 8.7 (L) 8.9 - 10.3 mg/dL   Total Protein 6.4 (L) 6.5 - 8.1 g/dL   Albumin 3.5 3.5 - 5.0 g/dL   AST 22 15 - 41 U/L   ALT 18 14 - 54 U/L   Alkaline Phosphatase 73 38 - 126 U/L   Total Bilirubin 0.6 0.3 - 1.2 mg/dL   GFR calc non Af  Amer >60 >60 mL/min   GFR calc Af Amer >60 >60 mL/min    Comment: (NOTE) The eGFR has been calculated using the CKD EPI equation. This calculation has not been validated in all clinical situations. eGFR's persistently <60 mL/min signify possible Chronic Kidney Disease.    Anion gap 10 5 - 15  Protime-INR     Status: None   Collection Time: 07/09/17  2:12 AM  Result Value Ref Range   Prothrombin Time 12.6 11.4 - 15.2 seconds   INR 0.96   CDS serology     Status: None   Collection Time: 07/09/17  2:12 AM  Result Value Ref Range   CDS serology specimen      SPECIMEN WILL BE HELD FOR 14 DAYS IF TESTING IS REQUIRED  CK     Status: None   Collection Time: 07/09/17  2:12 AM  Result Value Ref Range   Total CK 47 38 - 234 U/L  Ethanol     Status: None   Collection Time: 07/09/17  2:13 AM  Result Value Ref Range   Alcohol, Ethyl (B) <10 <10 mg/dL    Comment:        LOWEST DETECTABLE LIMIT FOR SERUM ALCOHOL IS 10 mg/dL FOR MEDICAL PURPOSES ONLY   Type and  screen [place]     Status: None   Collection Time: 07/09/17  2:15 AM  Result Value Ref Range   ABO/RH(D) A POS    Antibody Screen NEG    Sample Expiration 07/12/2017   ABO/Rh     Status: None (Preliminary result)   Collection Time: 07/09/17  2:15 AM  Result Value Ref Range   ABO/RH(D) A POS   I-stat chem 8, ed     Status: Abnormal   Collection Time: 07/09/17  2:16 AM  Result Value Ref Range   Sodium 140 135 - 145 mmol/L   Potassium 3.2 (L) 3.5 - 5.1 mmol/L   Chloride 102 101 - 111 mmol/L   BUN 19 6 - 20 mg/dL   Creatinine, Ser 0.50 0.44 - 1.00 mg/dL   Glucose, Bld 156 (H) 65 - 99 mg/dL   Calcium, Ion 1.12 (L) 1.15 - 1.40 mmol/L   TCO2 27 22 - 32 mmol/L   Hemoglobin 13.9 12.0 - 15.0 g/dL   HCT 41.0 36.0 - 46.0 %  I-Stat CG4 Lactic Acid, ED     Status: None   Collection Time: 07/09/17  2:19 AM  Result Value Ref Range   Lactic Acid, Venous 1.52 0.5 - 1.9 mmol/L  Urinalysis, Routine w reflex microscopic     Status: Abnormal   Collection Time: 07/09/17  4:44 AM  Result Value Ref Range   Color, Urine YELLOW YELLOW   APPearance CLEAR CLEAR   Specific Gravity, Urine 1.014 1.005 - 1.030   pH 7.0 5.0 - 8.0   Glucose, UA NEGATIVE NEGATIVE mg/dL   Hgb urine dipstick SMALL (A) NEGATIVE   Bilirubin Urine NEGATIVE NEGATIVE   Ketones, ur NEGATIVE NEGATIVE mg/dL   Protein, ur NEGATIVE NEGATIVE mg/dL   Nitrite NEGATIVE NEGATIVE   Leukocytes, UA NEGATIVE NEGATIVE   RBC / HPF 0-5 0 - 5 RBC/hpf   WBC, UA 0-5 0 - 5 WBC/hpf   Bacteria, UA RARE (A) NONE SEEN   Squamous Epithelial / LPF 0-5 (A) NONE SEEN    Dg Chest 1 View  Result Date: 07/09/2017 CLINICAL DATA:  Initial evaluation for acute trauma, fall. EXAM: CHEST 1 VIEW COMPARISON:  Prior  CT from earlier the same day. FINDINGS: Mild cardiomegaly. Mediastinal silhouette normal. Aortic atherosclerosis. Lungs normally inflated. No focal infiltrates. No pulmonary edema or appreciable pleural effusion. No pneumothorax.  Acute fracture through the right humeral neck with impaction. No other acute osseous abnormality. IMPRESSION: 1. Acute fracture through the right humeral neck with impaction. 2. No other acute cardiopulmonary abnormality within the chest. 3. Mild cardiomegaly with aortic atherosclerosis. Electronically Signed   By: Jeannine Boga M.D.   On: 07/09/2017 04:35   Dg Shoulder Right  Result Date: 07/09/2017 CLINICAL DATA:  Initial evaluation for acute trauma, fall. EXAM: RIGHT SHOULDER - 2+ VIEW COMPARISON:  Prior CT from earlier same day. FINDINGS: There is an acute transverse fracture through the surgical neck of the right humerus with slight impaction. Humeral head is rotated and slightly displaced laterally. Glenoid intact. Clavicle intact. Mild soft tissue swelling. Visualize right hemithorax is clear. IMPRESSION: Acute transverse fracture through the right humeral neck with impaction. Electronically Signed   By: Jeannine Boga M.D.   On: 07/09/2017 04:37   Ct Head Wo Contrast  Result Date: 07/09/2017 CLINICAL DATA:  Unwitnessed fall at home. EXAM: CT HEAD WITHOUT CONTRAST CT CERVICAL SPINE WITHOUT CONTRAST TECHNIQUE: Multidetector CT imaging of the head and cervical spine was performed following the standard protocol without intravenous contrast. Multiplanar CT image reconstructions of the cervical spine were also generated. COMPARISON:  None. FINDINGS: CT HEAD FINDINGS Brain: Trace dependent hyperdensity in the posterior oral left lateral ventricle suspicious for small amount of intraventricular hemorrhage. Similar hyperdensity in the right appears contiguous with the choroid plexus may be additional tiny site of hemorrhage or related to the choroid. Ventricular dilatation likely secondary to atrophy. Thin right subdural hematoma measures up to 2 mm in thickness. No midline shift. Moderate periventricular Jeremiah Tarpley matter changes consistent with chronic small vessel ischemia. No evidence of acute  ischemia. Vascular: Atherosclerosis of skullbase vasculature without hyperdense vessel or abnormal calcification. Skull: No fracture or focal lesion. Sinuses/Orbits: Paranasal sinuses and mastoid air cells are clear. The visualized orbits are unremarkable. Bilateral cataract resection. Other: Right frontal scalp hematoma. CT CERVICAL SPINE FINDINGS Alignment: Exaggerated cervical lordosis.  No traumatic subluxation. Skull base and vertebrae: The dens and skull base are intact. Cervical vertebral body heights are preserved. Mild loss of height of T1 vertebral body, moderate to severe T2 vertebral body, and mild T3 vertebral body. Degenerative pannus at the atlanto dens interval. Bones are under mineralized. Soft tissues and spinal canal: No prevertebral fluid or swelling. No visible canal hematoma. Disc levels: Disc space narrowing at C6-C7. Mild multilevel endplate spurring. Upper chest: Assessed on concurrent chest CT. Calcified granuloma at the left lung apex. Other: Carotid calcifications. IMPRESSION: 1. Thin acute right subdural hematoma measuring 2 mm. No midline shift. Small amount of intraventricular blood in the left and right lateral ventricle. 2. Right frontal scalp hematoma without skull fracture. 3. Generally cerebral atrophy. Moderate chronic small vessel ischemia. 4. No acute fracture or subluxation of the cervical spine. 5. Compression fractures of upper thoracic vertebra, characterized on concurrent chest CT. Critical Value/emergent results were called by telephone at the time of interpretation on 07/09/2017 at 4:02 am to Dr. Ezequiel Essex , who verbally acknowledged these results. Electronically Signed   By: Jeb Levering M.D.   On: 07/09/2017 04:02   Ct Chest W Contrast  Result Date: 07/09/2017 CLINICAL DATA:  Initial evaluation for acute trauma, unwitnessed fall. EXAM: CT CHEST, ABDOMEN, AND PELVIS WITH CONTRAST TECHNIQUE: Multidetector CT imaging of  the chest, abdomen and pelvis was  performed following the standard protocol during bolus administration of intravenous contrast. CONTRAST:  83m ISOVUE-300 IOPAMIDOL (ISOVUE-300) INJECTION 61% COMPARISON:  None. FINDINGS: CT CHEST FINDINGS Cardiovascular: Intrathoracic aorta of normal caliber without aneurysm or other acute abnormality. Extensive atheromatous plaque throughout the arch and descending intrathoracic aorta. Partially visualized great vessels are tortuous but within normal limits. Mild cardiomegaly noted. Diffuse 3 vessel coronary artery calcifications. No pericardial effusion. Limited evaluation of the pulmonary arterial tree grossly unremarkable. Mediastinum/Nodes: Thyroid normal. No pathologically enlarged mediastinal, hilar, or axillary lymph nodes. Esophagus within normal limits. Lungs/Pleura: Tracheobronchial tree intact and patent. Small layering bilateral pleural effusions with associated atelectasis. Lungs are otherwise clear. No focal infiltrates. No pulmonary edema. No pneumothorax. No worrisome pulmonary nodule or mass. Musculoskeletal: There are compression deformities involving the T2, T3, T6, T7, T8, and T9 vertebral bodies, favored to be chronic in nature. Exaggeration of the normal thoracic kyphosis. No acute rib fracture. There is an acute fracture through the neck of the right humerus with impaction. Adjacent soft tissue swelling. No discrete lytic or blastic osseous lesions. CT ABDOMEN PELVIS FINDINGS Hepatobiliary: Subcentimeter hypodensity within left hepatic lobe noted, too small the characterize, but of doubtful significance. Liver otherwise unremarkable. Gallbladder surgically absent. Mild prominence of common bile duct like related age and post cholecystectomy changes. Pancreas: Pancreas within normal limits. Spleen: Spleen within normal limits. Adrenals/Urinary Tract: Adrenal glands are normal. Kidneys equal in size with symmetric enhancement. Small right renal cyst noted. No nephrolithiasis or hydronephrosis.  No focal enhancing renal mass. No appreciable hydroureter. Bladder moderately distended without acute abnormality. Stomach/Bowel: Stomach within normal limits. No evidence for bowel obstruction or acute bowel injury. Large volume stool impacted within the rectal vault, consistent with constipation. No acute inflammatory changes seen about the bowels. Vascular/Lymphatic: Extensive atherosclerosis throughout the intra-abdominal aorta and its branch vessels. No aneurysm. Mesenteric vessels are patent proximally. No adenopathy. Reproductive: Uterus appears to be absent. Ovaries not discretely identified. Other: No free air or fluid. Musculoskeletal: External soft tissues within normal limits. There is an acute fracture through the right femoral neck. Bony pelvis otherwise intact. Compression deformity seen involving the L1 through L5 vertebral bodies, favored to be chronic. Sequelae of prior vertebral augmentation present at L3. No discrete lytic or blastic osseous lesions. IMPRESSION: 1. Acute fractures involving the right humeral neck and intertrochanteric left femur. 2. No other acute traumatic injury within the chest, abdomen, and pelvis. 3. Multiple compression deformities throughout the thoracolumbar spine as above, favored to be chronic in nature. Correlation physical exam recommended. 4. Large volume stool within the colon, consistent with constipation. 5. Small layering bilateral pleural effusions with associated atelectasis. 6. Extensive atherosclerosis. Electronically Signed   By: BJeannine BogaM.D.   On: 07/09/2017 04:15   Ct Cervical Spine Wo Contrast  Result Date: 07/09/2017 CLINICAL DATA:  Unwitnessed fall at home. EXAM: CT HEAD WITHOUT CONTRAST CT CERVICAL SPINE WITHOUT CONTRAST TECHNIQUE: Multidetector CT imaging of the head and cervical spine was performed following the standard protocol without intravenous contrast. Multiplanar CT image reconstructions of the cervical spine were also  generated. COMPARISON:  None. FINDINGS: CT HEAD FINDINGS Brain: Trace dependent hyperdensity in the posterior oral left lateral ventricle suspicious for small amount of intraventricular hemorrhage. Similar hyperdensity in the right appears contiguous with the choroid plexus may be additional tiny site of hemorrhage or related to the choroid. Ventricular dilatation likely secondary to atrophy. Thin right subdural hematoma measures up to 2 mm in  thickness. No midline shift. Moderate periventricular Emmalise Huard matter changes consistent with chronic small vessel ischemia. No evidence of acute ischemia. Vascular: Atherosclerosis of skullbase vasculature without hyperdense vessel or abnormal calcification. Skull: No fracture or focal lesion. Sinuses/Orbits: Paranasal sinuses and mastoid air cells are clear. The visualized orbits are unremarkable. Bilateral cataract resection. Other: Right frontal scalp hematoma. CT CERVICAL SPINE FINDINGS Alignment: Exaggerated cervical lordosis.  No traumatic subluxation. Skull base and vertebrae: The dens and skull base are intact. Cervical vertebral body heights are preserved. Mild loss of height of T1 vertebral body, moderate to severe T2 vertebral body, and mild T3 vertebral body. Degenerative pannus at the atlanto dens interval. Bones are under mineralized. Soft tissues and spinal canal: No prevertebral fluid or swelling. No visible canal hematoma. Disc levels: Disc space narrowing at C6-C7. Mild multilevel endplate spurring. Upper chest: Assessed on concurrent chest CT. Calcified granuloma at the left lung apex. Other: Carotid calcifications. IMPRESSION: 1. Thin acute right subdural hematoma measuring 2 mm. No midline shift. Small amount of intraventricular blood in the left and right lateral ventricle. 2. Right frontal scalp hematoma without skull fracture. 3. Generally cerebral atrophy. Moderate chronic small vessel ischemia. 4. No acute fracture or subluxation of the cervical spine.  5. Compression fractures of upper thoracic vertebra, characterized on concurrent chest CT. Critical Value/emergent results were called by telephone at the time of interpretation on 07/09/2017 at 4:02 am to Dr. Ezequiel Essex , who verbally acknowledged these results. Electronically Signed   By: Jeb Levering M.D.   On: 07/09/2017 04:02   Ct Abdomen Pelvis W Contrast  Result Date: 07/09/2017 CLINICAL DATA:  Initial evaluation for acute trauma, unwitnessed fall. EXAM: CT CHEST, ABDOMEN, AND PELVIS WITH CONTRAST TECHNIQUE: Multidetector CT imaging of the chest, abdomen and pelvis was performed following the standard protocol during bolus administration of intravenous contrast. CONTRAST:  2m ISOVUE-300 IOPAMIDOL (ISOVUE-300) INJECTION 61% COMPARISON:  None. FINDINGS: CT CHEST FINDINGS Cardiovascular: Intrathoracic aorta of normal caliber without aneurysm or other acute abnormality. Extensive atheromatous plaque throughout the arch and descending intrathoracic aorta. Partially visualized great vessels are tortuous but within normal limits. Mild cardiomegaly noted. Diffuse 3 vessel coronary artery calcifications. No pericardial effusion. Limited evaluation of the pulmonary arterial tree grossly unremarkable. Mediastinum/Nodes: Thyroid normal. No pathologically enlarged mediastinal, hilar, or axillary lymph nodes. Esophagus within normal limits. Lungs/Pleura: Tracheobronchial tree intact and patent. Small layering bilateral pleural effusions with associated atelectasis. Lungs are otherwise clear. No focal infiltrates. No pulmonary edema. No pneumothorax. No worrisome pulmonary nodule or mass. Musculoskeletal: There are compression deformities involving the T2, T3, T6, T7, T8, and T9 vertebral bodies, favored to be chronic in nature. Exaggeration of the normal thoracic kyphosis. No acute rib fracture. There is an acute fracture through the neck of the right humerus with impaction. Adjacent soft tissue swelling.  No discrete lytic or blastic osseous lesions. CT ABDOMEN PELVIS FINDINGS Hepatobiliary: Subcentimeter hypodensity within left hepatic lobe noted, too small the characterize, but of doubtful significance. Liver otherwise unremarkable. Gallbladder surgically absent. Mild prominence of common bile duct like related age and post cholecystectomy changes. Pancreas: Pancreas within normal limits. Spleen: Spleen within normal limits. Adrenals/Urinary Tract: Adrenal glands are normal. Kidneys equal in size with symmetric enhancement. Small right renal cyst noted. No nephrolithiasis or hydronephrosis. No focal enhancing renal mass. No appreciable hydroureter. Bladder moderately distended without acute abnormality. Stomach/Bowel: Stomach within normal limits. No evidence for bowel obstruction or acute bowel injury. Large volume stool impacted within the rectal vault, consistent with  constipation. No acute inflammatory changes seen about the bowels. Vascular/Lymphatic: Extensive atherosclerosis throughout the intra-abdominal aorta and its branch vessels. No aneurysm. Mesenteric vessels are patent proximally. No adenopathy. Reproductive: Uterus appears to be absent. Ovaries not discretely identified. Other: No free air or fluid. Musculoskeletal: External soft tissues within normal limits. There is an acute fracture through the right femoral neck. Bony pelvis otherwise intact. Compression deformity seen involving the L1 through L5 vertebral bodies, favored to be chronic. Sequelae of prior vertebral augmentation present at L3. No discrete lytic or blastic osseous lesions. IMPRESSION: 1. Acute fractures involving the right humeral neck and intertrochanteric left femur. 2. No other acute traumatic injury within the chest, abdomen, and pelvis. 3. Multiple compression deformities throughout the thoracolumbar spine as above, favored to be chronic in nature. Correlation physical exam recommended. 4. Large volume stool within the colon,  consistent with constipation. 5. Small layering bilateral pleural effusions with associated atelectasis. 6. Extensive atherosclerosis. Electronically Signed   By: Jeannine Boga M.D.   On: 07/09/2017 04:15   Dg Hip Unilat With Pelvis 2-3 Views Right  Result Date: 07/09/2017 CLINICAL DATA:  Initial evaluation for acute trauma, fall. EXAM: DG HIP (WITH OR WITHOUT PELVIS) 2-3V RIGHT COMPARISON:  Prior CT from earlier same day. FINDINGS: Acute nondisplaced intertrochanteric fracture of the right femur. Right femoral head remains normally aligned within the acetabulum. Femoral head height preserved. Bony pelvis intact. SI joints approximated. No acute osseus abnormality about the left hip. IV contrast material present within the bladder. Sequelae of prior vertebral augmentation present within the lower lumbar spine. IMPRESSION: Acute nondisplaced intertrochanteric fracture of the right femur. Electronically Signed   By: Jeannine Boga M.D.   On: 07/09/2017 04:36    Review of Systems  Constitutional: Negative for chills and fever.  HENT: Negative for ear discharge, hearing loss and nosebleeds.   Eyes: Negative for blurred vision and double vision.  Respiratory: Negative for cough and shortness of breath.   Cardiovascular: Negative for chest pain and leg swelling.  Gastrointestinal: Positive for constipation. Negative for abdominal pain, nausea and vomiting.  Genitourinary: Negative for flank pain and hematuria.  Musculoskeletal: Positive for falls and joint pain.  Skin: Negative for itching and rash.  Neurological: Negative for dizziness, sensory change and loss of consciousness.  Endo/Heme/Allergies: Negative for polydipsia. Does not bruise/bleed easily.   Blood pressure (!) 159/83, pulse 89, temperature 98.2 F (36.8 C), temperature source Oral, resp. rate 16, SpO2 94 %. Physical Exam  Constitutional: She is oriented to person, place, and time. She appears well-developed.  Frail  appearing  HENT:  Head: Normocephalic.  Small abrasion to left forehead  Eyes: Conjunctivae and EOM are normal. Pupils are equal, round, and reactive to light.  Neck: Normal range of motion. Neck supple.  Cardiovascular: Normal rate and regular rhythm.  Respiratory: Effort normal and breath sounds normal.  GI: Soft. There is no tenderness. There is no rebound and no guarding.  Genitourinary: Vagina normal.  Musculoskeletal:  Unable to move RLE and RUE due to pain. Sensation intact. Grip strength normal. Symmetric foot strength.  Neurological: She is alert and oriented to person, place, and time.  Skin: Skin is warm and dry.  Psychiatric: She has a normal mood and affect. Her behavior is normal.    Assessment/Injury summary: -Acute right SDH (21m) without midline shift -Small amount of intraventricular blood -Right frontal scalp hematoma -Right humeral neck fx -Right intertrochanteric right femur fx -Constipation  PLAN: -Neurosurgery consulted, Dr. JRonnald Ramp-Ortho consulted,  Dr. Lorin Mercy -Admit, NPO, IVF -Pain control -PT/OT/Rehab consults -Milk of mag now; daily miralax; BID colace  Sharon Mt. Dema Severin, M.D. [facility] 07/09/2017, 6:04 AM

## 2017-07-09 NOTE — ED Triage Notes (Addendum)
Per [facility], pt had unwitnessed fall at home. Pt denies loss of consciousness. Pt does not know how long she was out for. PTAR first to scene and called [facility]. PTAR reports crepitus in right shoulder and unstable right pelvis. Pt cant straighten right leg and reported 8/10 right leg and right pelvis pain. [facility] gave 50 mcg of fentanyl at 0110. Pt became nauseous and vomited twice at 0125. [facility] gave 4 of zofran. EMS does not know pt baseline neuro status. Pt is only oriented to person. Pt is alert. Family denies pt use of blood thinners to EMS. Pt has large hematoma to right forehead with two lacerations present. Pulse present in right foot.

## 2017-07-09 NOTE — ED Provider Notes (Signed)
[facility] EMERGENCY DEPARTMENT Provider Note   CSN: [id number] Arrival date & time: 07/09/17  0147     History   Chief Complaint Chief Complaint  Patient presents with  . Fall    HPI Misty [name] is a 82 y.o. female.  Patient presents via EMS after unwitnessed fall at home.  Unknown how long she was down for.  Patient does not remember falling.  She has pain to her right head, shoulder, hip and pelvis.  She has had 2 episodes of vomiting.  She is oriented to person and place.  She complains of hip and shoulder pain.  She denies any blood thinner use.  She is visiting from out of town and living with family members.  She reports history of high blood pressure and high cholesterol.  She denies any preceding dizziness or lightheadedness.  No chest pain or shortness of breath.   The history is provided by the patient and the EMS personnel. The history is limited by the condition of the patient.  Fall  Associated symptoms include headaches. Pertinent negatives include no chest pain and no abdominal pain.    Past Medical History:  Diagnosis Date  . Hypertension     There are no active problems to display for this patient.   **The histories are not reviewed yet. Please review them in the "History" navigator section and refresh this SmartLink.  OB History    No data available       Home Medications    Prior to Admission medications   Not on File    Family History No family history on file.  Social History Social History   Tobacco Use  . Smoking status: Not on file  Substance Use Topics  . Alcohol use: Not on file  . Drug use: Not on file     Allergies   Sulfa antibiotics   Review of Systems Review of Systems  Constitutional: Negative for activity change, appetite change and fever.  HENT: Negative for congestion and rhinorrhea.   Eyes: Negative for visual disturbance.  Respiratory: Negative for chest tightness.   Cardiovascular:  Negative for chest pain.  Gastrointestinal: Negative for abdominal pain.  Genitourinary: Negative for vaginal bleeding and vaginal discharge.  Musculoskeletal: Positive for arthralgias, back pain, myalgias and neck pain.  Skin: Negative for rash.  Neurological: Positive for headaches. Negative for weakness.   all other systems are negative except as noted in the HPI and PMH.     Physical Exam Updated Vital Signs BP (!) 166/85   Pulse 88   Temp 98.2 F (36.8 C) (Oral)   Resp 18   SpO2 100%   Physical Exam  Constitutional: She is oriented to person, place, and time. She appears well-developed and well-nourished. No distress.  HENT:  Head: Normocephalic and atraumatic.  Mouth/Throat: Oropharynx is clear and moist. No oropharyngeal exudate.  Large hematoma to right forehead  Eyes: Conjunctivae and EOM are normal. Pupils are equal, round, and reactive to light.  Neck: Normal range of motion. Neck supple.  No C-spine tenderness  Cardiovascular: Normal rate, regular rhythm, normal heart sounds and intact distal pulses.  No murmur heard. Pulmonary/Chest: Effort normal and breath sounds normal. No respiratory distress. She exhibits no tenderness.  Abdominal: Soft. There is no tenderness. There is no rebound and no guarding.  Musculoskeletal: She exhibits edema, tenderness and deformity.  Large hematoma and tenderness to right anterior shoulder Intact radial pulse.  Pain to right lateral hip.  Leg is  shortened and externally rotated.  Intact DP pulse  Neurological: She is alert and oriented to person, place, and time. No cranial nerve deficit. She exhibits normal muscle tone. Coordination normal.   5/5 strength throughout. CN 2-12 intact.Equal grip strength.   Skin: Skin is warm. Capillary refill takes less than 2 seconds.  Psychiatric: She has a normal mood and affect. Her behavior is normal.  Nursing note and vitals reviewed.    ED Treatments / Results  Labs (all labs ordered  are listed, but only abnormal results are displayed) Labs Reviewed  CBC WITH DIFFERENTIAL/PLATELET - Abnormal; Notable for the following components:      Result Value   Neutro Abs 7.8 (*)    All other components within normal limits  COMPREHENSIVE METABOLIC PANEL - Abnormal; Notable for the following components:   Potassium 3.1 (*)    Glucose, Bld 156 (*)    Calcium 8.7 (*)    Total Protein 6.4 (*)    All other components within normal limits  URINALYSIS, ROUTINE W REFLEX MICROSCOPIC - Abnormal; Notable for the following components:   Hgb urine dipstick SMALL (*)    Bacteria, UA RARE (*)    Squamous Epithelial / LPF 0-5 (*)    All other components within normal limits  I-STAT CHEM 8, ED - Abnormal; Notable for the following components:   Potassium 3.2 (*)    Glucose, Bld 156 (*)    Calcium, Ion 1.12 (*)    All other components within normal limits  PROTIME-INR  CDS SEROLOGY  ETHANOL  CK  I-STAT CHEM 8, ED  I-STAT CG4 LACTIC ACID, ED  TYPE AND SCREEN  ABO/RH    EKG  EKG Interpretation  Date/Time:  Monday July 09 2017 04:24:00 EST Ventricular Rate:  93 PR Interval:    QRS Duration: 85 QT Interval:  374 QTC Calculation: 466 R Axis:   76 Text Interpretation:  Sinus rhythm Nonspecific T abnormalities, lateral leads Baseline wander in lead(s) I II aVR No previous ECGs available Confirmed by Glynn Octave [phone]) on 07/09/2017 4:27:57 AM       Radiology Dg Chest 1 View  Result Date: 07/09/2017 CLINICAL DATA:  Initial evaluation for acute trauma, fall. EXAM: CHEST 1 VIEW COMPARISON:  Prior CT from earlier the same day. FINDINGS: Mild cardiomegaly. Mediastinal silhouette normal. Aortic atherosclerosis. Lungs normally inflated. No focal infiltrates. No pulmonary edema or appreciable pleural effusion. No pneumothorax. Acute fracture through the right humeral neck with impaction. No other acute osseous abnormality. IMPRESSION: 1. Acute fracture through the right humeral  neck with impaction. 2. No other acute cardiopulmonary abnormality within the chest. 3. Mild cardiomegaly with aortic atherosclerosis. Electronically Signed   By: Rise Mu M.D.   On: 07/09/2017 04:35   Ct Head Wo Contrast  Result Date: 07/09/2017 CLINICAL DATA:  Unwitnessed fall at home. EXAM: CT HEAD WITHOUT CONTRAST CT CERVICAL SPINE WITHOUT CONTRAST TECHNIQUE: Multidetector CT imaging of the head and cervical spine was performed following the standard protocol without intravenous contrast. Multiplanar CT image reconstructions of the cervical spine were also generated. COMPARISON:  None. FINDINGS: CT HEAD FINDINGS Brain: Trace dependent hyperdensity in the posterior oral left lateral ventricle suspicious for small amount of intraventricular hemorrhage. Similar hyperdensity in the right appears contiguous with the choroid plexus may be additional tiny site of hemorrhage or related to the choroid. Ventricular dilatation likely secondary to atrophy. Thin right subdural hematoma measures up to 2 mm in thickness. No midline shift. Moderate periventricular white matter changes  consistent with chronic small vessel ischemia. No evidence of acute ischemia. Vascular: Atherosclerosis of skullbase vasculature without hyperdense vessel or abnormal calcification. Skull: No fracture or focal lesion. Sinuses/Orbits: Paranasal sinuses and mastoid air cells are clear. The visualized orbits are unremarkable. Bilateral cataract resection. Other: Right frontal scalp hematoma. CT CERVICAL SPINE FINDINGS Alignment: Exaggerated cervical lordosis.  No traumatic subluxation. Skull base and vertebrae: The dens and skull base are intact. Cervical vertebral body heights are preserved. Mild loss of height of T1 vertebral body, moderate to severe T2 vertebral body, and mild T3 vertebral body. Degenerative pannus at the atlanto dens interval. Bones are under mineralized. Soft tissues and spinal canal: No prevertebral fluid or  swelling. No visible canal hematoma. Disc levels: Disc space narrowing at C6-C7. Mild multilevel endplate spurring. Upper chest: Assessed on concurrent chest CT. Calcified granuloma at the left lung apex. Other: Carotid calcifications. IMPRESSION: 1. Thin acute right subdural hematoma measuring 2 mm. No midline shift. Small amount of intraventricular blood in the left and right lateral ventricle. 2. Right frontal scalp hematoma without skull fracture. 3. Generally cerebral atrophy. Moderate chronic small vessel ischemia. 4. No acute fracture or subluxation of the cervical spine. 5. Compression fractures of upper thoracic vertebra, characterized on concurrent chest CT. Critical Value/emergent results were called by telephone at the time of interpretation on 07/09/2017 at 4:02 am to Dr. Glynn OctaveSTEPHEN Maria Coin , who verbally acknowledged these results. Electronically Signed   By: Rubye OaksMelanie  Ehinger M.D.   On: 07/09/2017 04:02   Ct Chest W Contrast  Result Date: 07/09/2017 CLINICAL DATA:  Initial evaluation for acute trauma, unwitnessed fall. EXAM: CT CHEST, ABDOMEN, AND PELVIS WITH CONTRAST TECHNIQUE: Multidetector CT imaging of the chest, abdomen and pelvis was performed following the standard protocol during bolus administration of intravenous contrast. CONTRAST:  50mL ISOVUE-300 IOPAMIDOL (ISOVUE-300) INJECTION 61% COMPARISON:  None. FINDINGS: CT CHEST FINDINGS Cardiovascular: Intrathoracic aorta of normal caliber without aneurysm or other acute abnormality. Extensive atheromatous plaque throughout the arch and descending intrathoracic aorta. Partially visualized great vessels are tortuous but within normal limits. Mild cardiomegaly noted. Diffuse 3 vessel coronary artery calcifications. No pericardial effusion. Limited evaluation of the pulmonary arterial tree grossly unremarkable. Mediastinum/Nodes: Thyroid normal. No pathologically enlarged mediastinal, hilar, or axillary lymph nodes. Esophagus within normal limits.  Lungs/Pleura: Tracheobronchial tree intact and patent. Small layering bilateral pleural effusions with associated atelectasis. Lungs are otherwise clear. No focal infiltrates. No pulmonary edema. No pneumothorax. No worrisome pulmonary nodule or mass. Musculoskeletal: There are compression deformities involving the T2, T3, T6, T7, T8, and T9 vertebral bodies, favored to be chronic in nature. Exaggeration of the normal thoracic kyphosis. No acute rib fracture. There is an acute fracture through the neck of the right humerus with impaction. Adjacent soft tissue swelling. No discrete lytic or blastic osseous lesions. CT ABDOMEN PELVIS FINDINGS Hepatobiliary: Subcentimeter hypodensity within left hepatic lobe noted, too small the characterize, but of doubtful significance. Liver otherwise unremarkable. Gallbladder surgically absent. Mild prominence of common bile duct like related age and post cholecystectomy changes. Pancreas: Pancreas within normal limits. Spleen: Spleen within normal limits. Adrenals/Urinary Tract: Adrenal glands are normal. Kidneys equal in size with symmetric enhancement. Small right renal cyst noted. No nephrolithiasis or hydronephrosis. No focal enhancing renal mass. No appreciable hydroureter. Bladder moderately distended without acute abnormality. Stomach/Bowel: Stomach within normal limits. No evidence for bowel obstruction or acute bowel injury. Large volume stool impacted within the rectal vault, consistent with constipation. No acute inflammatory changes seen about the bowels. Vascular/Lymphatic:  Extensive atherosclerosis throughout the intra-abdominal aorta and its branch vessels. No aneurysm. Mesenteric vessels are patent proximally. No adenopathy. Reproductive: Uterus appears to be absent. Ovaries not discretely identified. Other: No free air or fluid. Musculoskeletal: External soft tissues within normal limits. There is an acute fracture through the right femoral neck. Bony pelvis  otherwise intact. Compression deformity seen involving the L1 through L5 vertebral bodies, favored to be chronic. Sequelae of prior vertebral augmentation present at L3. No discrete lytic or blastic osseous lesions. IMPRESSION: 1. Acute fractures involving the right humeral neck and intertrochanteric left femur. 2. No other acute traumatic injury within the chest, abdomen, and pelvis. 3. Multiple compression deformities throughout the thoracolumbar spine as above, favored to be chronic in nature. Correlation physical exam recommended. 4. Large volume stool within the colon, consistent with constipation. 5. Small layering bilateral pleural effusions with associated atelectasis. 6. Extensive atherosclerosis. Electronically Signed   By: Rise Mu M.D.   On: 07/09/2017 04:15   Ct Cervical Spine Wo Contrast  Result Date: 07/09/2017 CLINICAL DATA:  Unwitnessed fall at home. EXAM: CT HEAD WITHOUT CONTRAST CT CERVICAL SPINE WITHOUT CONTRAST TECHNIQUE: Multidetector CT imaging of the head and cervical spine was performed following the standard protocol without intravenous contrast. Multiplanar CT image reconstructions of the cervical spine were also generated. COMPARISON:  None. FINDINGS: CT HEAD FINDINGS Brain: Trace dependent hyperdensity in the posterior oral left lateral ventricle suspicious for small amount of intraventricular hemorrhage. Similar hyperdensity in the right appears contiguous with the choroid plexus may be additional tiny site of hemorrhage or related to the choroid. Ventricular dilatation likely secondary to atrophy. Thin right subdural hematoma measures up to 2 mm in thickness. No midline shift. Moderate periventricular white matter changes consistent with chronic small vessel ischemia. No evidence of acute ischemia. Vascular: Atherosclerosis of skullbase vasculature without hyperdense vessel or abnormal calcification. Skull: No fracture or focal lesion. Sinuses/Orbits: Paranasal sinuses  and mastoid air cells are clear. The visualized orbits are unremarkable. Bilateral cataract resection. Other: Right frontal scalp hematoma. CT CERVICAL SPINE FINDINGS Alignment: Exaggerated cervical lordosis.  No traumatic subluxation. Skull base and vertebrae: The dens and skull base are intact. Cervical vertebral body heights are preserved. Mild loss of height of T1 vertebral body, moderate to severe T2 vertebral body, and mild T3 vertebral body. Degenerative pannus at the atlanto dens interval. Bones are under mineralized. Soft tissues and spinal canal: No prevertebral fluid or swelling. No visible canal hematoma. Disc levels: Disc space narrowing at C6-C7. Mild multilevel endplate spurring. Upper chest: Assessed on concurrent chest CT. Calcified granuloma at the left lung apex. Other: Carotid calcifications. IMPRESSION: 1. Thin acute right subdural hematoma measuring 2 mm. No midline shift. Small amount of intraventricular blood in the left and right lateral ventricle. 2. Right frontal scalp hematoma without skull fracture. 3. Generally cerebral atrophy. Moderate chronic small vessel ischemia. 4. No acute fracture or subluxation of the cervical spine. 5. Compression fractures of upper thoracic vertebra, characterized on concurrent chest CT. Critical Value/emergent results were called by telephone at the time of interpretation on 07/09/2017 at 4:02 am to Dr. Glynn Octave , who verbally acknowledged these results. Electronically Signed   By: Rubye Oaks M.D.   On: 07/09/2017 04:02   Ct Abdomen Pelvis W Contrast  Result Date: 07/09/2017 CLINICAL DATA:  Initial evaluation for acute trauma, unwitnessed fall. EXAM: CT CHEST, ABDOMEN, AND PELVIS WITH CONTRAST TECHNIQUE: Multidetector CT imaging of the chest, abdomen and pelvis was performed following the standard protocol  during bolus administration of intravenous contrast. CONTRAST:  50mL ISOVUE-300 IOPAMIDOL (ISOVUE-300) INJECTION 61% COMPARISON:  None.  FINDINGS: CT CHEST FINDINGS Cardiovascular: Intrathoracic aorta of normal caliber without aneurysm or other acute abnormality. Extensive atheromatous plaque throughout the arch and descending intrathoracic aorta. Partially visualized great vessels are tortuous but within normal limits. Mild cardiomegaly noted. Diffuse 3 vessel coronary artery calcifications. No pericardial effusion. Limited evaluation of the pulmonary arterial tree grossly unremarkable. Mediastinum/Nodes: Thyroid normal. No pathologically enlarged mediastinal, hilar, or axillary lymph nodes. Esophagus within normal limits. Lungs/Pleura: Tracheobronchial tree intact and patent. Small layering bilateral pleural effusions with associated atelectasis. Lungs are otherwise clear. No focal infiltrates. No pulmonary edema. No pneumothorax. No worrisome pulmonary nodule or mass. Musculoskeletal: There are compression deformities involving the T2, T3, T6, T7, T8, and T9 vertebral bodies, favored to be chronic in nature. Exaggeration of the normal thoracic kyphosis. No acute rib fracture. There is an acute fracture through the neck of the right humerus with impaction. Adjacent soft tissue swelling. No discrete lytic or blastic osseous lesions. CT ABDOMEN PELVIS FINDINGS Hepatobiliary: Subcentimeter hypodensity within left hepatic lobe noted, too small the characterize, but of doubtful significance. Liver otherwise unremarkable. Gallbladder surgically absent. Mild prominence of common bile duct like related age and post cholecystectomy changes. Pancreas: Pancreas within normal limits. Spleen: Spleen within normal limits. Adrenals/Urinary Tract: Adrenal glands are normal. Kidneys equal in size with symmetric enhancement. Small right renal cyst noted. No nephrolithiasis or hydronephrosis. No focal enhancing renal mass. No appreciable hydroureter. Bladder moderately distended without acute abnormality. Stomach/Bowel: Stomach within normal limits. No evidence for  bowel obstruction or acute bowel injury. Large volume stool impacted within the rectal vault, consistent with constipation. No acute inflammatory changes seen about the bowels. Vascular/Lymphatic: Extensive atherosclerosis throughout the intra-abdominal aorta and its branch vessels. No aneurysm. Mesenteric vessels are patent proximally. No adenopathy. Reproductive: Uterus appears to be absent. Ovaries not discretely identified. Other: No free air or fluid. Musculoskeletal: External soft tissues within normal limits. There is an acute fracture through the right femoral neck. Bony pelvis otherwise intact. Compression deformity seen involving the L1 through L5 vertebral bodies, favored to be chronic. Sequelae of prior vertebral augmentation present at L3. No discrete lytic or blastic osseous lesions. IMPRESSION: 1. Acute fractures involving the right humeral neck and intertrochanteric left femur. 2. No other acute traumatic injury within the chest, abdomen, and pelvis. 3. Multiple compression deformities throughout the thoracolumbar spine as above, favored to be chronic in nature. Correlation physical exam recommended. 4. Large volume stool within the colon, consistent with constipation. 5. Small layering bilateral pleural effusions with associated atelectasis. 6. Extensive atherosclerosis. Electronically Signed   By: Rise MuBenjamin  McClintock M.D.   On: 07/09/2017 04:15    Procedures Procedures (including critical care time)  Medications Ordered in ED Medications  ondansetron (ZOFRAN) injection 4 mg (not administered)  iopamidol (ISOVUE-300) 61 % injection (not administered)     Initial Impression / Assessment and Plan / ED Course  I have reviewed the triage vital signs and the nursing notes.  Pertinent labs & imaging results that were available during my care of the patient were reviewed by me and considered in my medical decision making (see chart for details).    Patient found down at home after  unwitnessed fall.  Has likely shoulder fracture and hip fracture.  Large hematoma to head.  GCS is 15.  She is oriented to person.  Moving all extremities.  Complains of right shoulder and right hip pain.  Imaging is remarkable for right intertrochanteric hip fracture as well as right humeral neck fracture.  Discussed with Dr. Ophelia Charter of orthopedics.  He will see later today.  Recommends Buck's traction and sling.  Patient also has small subdural hematoma and IVH. D/w resident for Dr. Yetta Barre neurosurgery. They will evaluate. Likely no operative intervention needed.  Labs reassuring. CK normal.   Admission to trauma service d/w Dr. Cliffton Asters.   CRITICAL CARE Performed by: Glynn Octave Total critical care time: 40 minutes Critical care time was exclusive of separately billable procedures and treating other patients. Critical care was necessary to treat or prevent imminent or life-threatening deterioration. Critical care was time spent personally by me on the following activities: development of treatment plan with patient and/or surrogate as well as nursing, discussions with consultants, evaluation of patient's response to treatment, examination of patient, obtaining history from patient or surrogate, ordering and performing treatments and interventions, ordering and review of laboratory studies, ordering and review of radiographic studies, pulse oximetry and re-evaluation of patient's condition.   Final Clinical Impressions(s) / ED Diagnoses   Final diagnoses:  Fall, initial encounter  Closed right hip fracture, initial encounter (HCC)  Fx humeral neck, right, closed, initial encounter  SDH (subdural hematoma) [facility])    ED Discharge Orders    None       Glynn Octave, MD 07/09/17 [phone]

## 2017-07-09 NOTE — Progress Notes (Signed)
Thank you for consult on Misty [name]. Chart reviewed and note that therapy evaluations pending. Will await completion of evaluations to help determine appropriate rehab venue.

## 2017-07-09 NOTE — ED Notes (Addendum)
Trauma MD returned call regarding refusal of [facility] to accept report, and change in inpatient bed to [address] [place]. Awaiting return call to be able to call report to floor.

## 2017-07-09 NOTE — ED Notes (Signed)
Pt denying nausea at this time. Zofran held.

## 2017-07-09 NOTE — Consult Note (Signed)
Physical Medicine and Rehabilitation Consult   Reason for Consult: Fall with TBI, Right humerus fracture and right femur fracture.  Referring Physician: Trauma MD.    HPI: Misty [name] is a 82 y.o. female with history of HTN, IBS, gait disorder with multiple falls (in rehab center last year after fall), lumbar compression fractures; who was admitted on 07/09/17 after fall with right shoulder and left hip pain with inability to walk.  Work up done revealing acute transverse fracture thorough right humeral neck with impaction, right SDH with right frontal scalp hematoma, multiple thoracolumbar deformities felt to be chronic in nature and acute non-displace right IT femur fracture.  Dr. Yetta Barre recommended conservative management of SDH/CHI. Right  Shoulder placed in sling and RLE in placed in [place] traction. He underwent ORIF right hip on 12/19 by Dr. Ophelia Charter and to be WBAT for transfers.  Is NWB on RUE. Therapy evaluations to be done today.    Review of Systems  Constitutional: Negative for chills and fever.  HENT: Negative for hearing loss and tinnitus.   Eyes: Positive for blurred vision. Negative for double vision.  Respiratory: Negative for cough and shortness of breath.   Cardiovascular: Negative for chest pain, palpitations and leg swelling.  Gastrointestinal: Positive for constipation. Negative for nausea.  Genitourinary: Negative for dysuria and urgency.  Musculoskeletal: Positive for back pain, joint pain and myalgias.  Neurological: Positive for focal weakness, weakness and headaches (since the fall). Negative for dizziness.  Psychiatric/Behavioral: Positive for memory loss. Negative for depression.      Past Medical History:  Diagnosis Date  . Hypertension     History reviewed. No pertinent surgical history.    History reviewed. No pertinent family history.    Social History:  Widowed. Moved to [place] this summer after husband passed away--is the caregiver for two young  grandchildren.  Lives with son and daughter in law who work during the day. She does not use tobacco, smokeless tobacco or alcohol.    Allergies  Allergen Reactions  . Darvon [Propoxyphene] Other (See Comments)    Unknown allergic reaction  . Hydrocodone Nausea And Vomiting  . Lactose Other (See Comments)    Unknown allergic reaction  . Other Other (See Comments)    Unknown allergic reaction to sodium pentothal  . Sulfa Antibiotics Other (See Comments)    Unknown allergic reaction  . Valsartan Other (See Comments)    Unknown allergic reaction - reported by [facility] medical 05/12/17    Medications Prior to Admission  Medication Sig Dispense Refill  . amLODipine (NORVASC) 5 MG tablet Take 7.5 mg by mouth daily.    . calcitonin, salmon, (MIACALCIN/FORTICAL) 200 UNIT/ACT nasal spray Place 1 spray into alternate nostrils daily.    Marland Kitchen dicyclomine (BENTYL) 10 MG capsule Take 10 mg by mouth daily.    Marland Kitchen loratadine (CLARITIN) 10 MG tablet Take 10 mg by mouth daily.    . traMADol (ULTRAM) 50 MG tablet Take 50 mg by mouth 2 (two) times daily.      Home:    Functional History:   Functional Status:  Mobility:          ADL:    Cognition: Cognition Orientation Level: (S) Oriented to person, Oriented to place, Disoriented to time, Disoriented to situation    Blood pressure (!) 162/80, pulse 94, temperature 98.2 F (36.8 C), temperature source Oral, resp. rate 18, SpO2 91 %. Physical Exam  Nursing note and vitals reviewed. Constitutional: She appears well-developed  and well-nourished. No distress.  Elderly female in bed with sling RUE.  HENT:  Head: Normocephalic and atraumatic.  Mouth/Throat: Oropharynx is clear and moist.  Resolving ecchymosis right forehead  Eyes: Conjunctivae and EOM are normal. Pupils are equal, round, and reactive to light.  Neck: Normal range of motion. Neck supple.  Cardiovascular: Normal rate and regular rhythm.  Respiratory: Effort normal and  breath sounds normal. No stridor. No respiratory distress. She has no wheezes.  GI: Soft. Bowel sounds are normal. She exhibits distension. There is no tenderness.  Musculoskeletal: She exhibits edema and tenderness.  Ecchymotic area left knee. Foam dressing right hip with min edema and thigh with resolving ecchymosis .   Neurological: She is alert.  Speech clear--tangential needing redirection. Oriented to self and situation. Place "hospital in [place]") thought it was spring. She is able to follow simple motor commands but anxious with pain inhibition RLE.   Skin: Skin is warm and dry. She is not diaphoretic.  Psychiatric: She has a normal mood and affect. Her behavior is normal.    Results for orders placed or performed during the hospital encounter of 07/09/17 (from the past 24 hour(s))  CBC with Differential/Platelet     Status: Abnormal   Collection Time: 07/09/17  2:12 AM  Result Value Ref Range   WBC 10.5 4.0 - 10.5 K/uL   RBC 4.33 3.87 - 5.11 MIL/uL   Hemoglobin 13.1 12.0 - 15.0 g/dL   HCT 16.1 09.6 - 04.5 %   MCV 91.2 78.0 - 100.0 fL   MCH 30.3 26.0 - 34.0 pg   MCHC 33.2 30.0 - 36.0 g/dL   RDW 40.9 81.1 - 91.4 %   Platelets 333 150 - 400 K/uL   Neutrophils Relative % 75 %   Neutro Abs 7.8 (H) 1.7 - 7.7 K/uL   Lymphocytes Relative 16 %   Lymphs Abs 1.7 0.7 - 4.0 K/uL   Monocytes Relative 8 %   Monocytes Absolute 0.9 0.1 - 1.0 K/uL   Eosinophils Relative 1 %   Eosinophils Absolute 0.1 0.0 - 0.7 K/uL   Basophils Relative 0 %   Basophils Absolute 0.0 0.0 - 0.1 K/uL  Comprehensive metabolic panel     Status: Abnormal   Collection Time: 07/09/17  2:12 AM  Result Value Ref Range   Sodium 137 135 - 145 mmol/L   Potassium 3.1 (L) 3.5 - 5.1 mmol/L   Chloride 102 101 - 111 mmol/L   CO2 25 22 - 32 mmol/L   Glucose, Bld 156 (H) 65 - 99 mg/dL   BUN 17 6 - 20 mg/dL   Creatinine, Ser 7.82 0.44 - 1.00 mg/dL   Calcium 8.7 (L) 8.9 - 10.3 mg/dL   Total Protein 6.4 (L) 6.5 - 8.1  g/dL   Albumin 3.5 3.5 - 5.0 g/dL   AST 22 15 - 41 U/L   ALT 18 14 - 54 U/L   Alkaline Phosphatase 73 38 - 126 U/L   Total Bilirubin 0.6 0.3 - 1.2 mg/dL   GFR calc non Af Amer >60 >60 mL/min   GFR calc Af Amer >60 >60 mL/min   Anion gap 10 5 - 15  Protime-INR     Status: None   Collection Time: 07/09/17  2:12 AM  Result Value Ref Range   Prothrombin Time 12.6 11.4 - 15.2 seconds   INR 0.96   CDS serology     Status: None   Collection Time: 07/09/17  2:12 AM  Result Value  Ref Range   CDS serology specimen      SPECIMEN WILL BE HELD FOR 14 DAYS IF TESTING IS REQUIRED  CK     Status: None   Collection Time: 07/09/17  2:12 AM  Result Value Ref Range   Total CK 47 38 - 234 U/L  Ethanol     Status: None   Collection Time: 07/09/17  2:13 AM  Result Value Ref Range   Alcohol, Ethyl (B) <10 <10 mg/dL  Type and screen [facility]     Status: None   Collection Time: 07/09/17  2:15 AM  Result Value Ref Range   ABO/RH(D) A POS    Antibody Screen NEG    Sample Expiration 07/12/2017   ABO/Rh     Status: None   Collection Time: 07/09/17  2:15 AM  Result Value Ref Range   ABO/RH(D) A POS   I-stat chem 8, ed     Status: Abnormal   Collection Time: 07/09/17  2:16 AM  Result Value Ref Range   Sodium 140 135 - 145 mmol/L   Potassium 3.2 (L) 3.5 - 5.1 mmol/L   Chloride 102 101 - 111 mmol/L   BUN 19 6 - 20 mg/dL   Creatinine, Ser 1.61 0.44 - 1.00 mg/dL   Glucose, Bld 096 (H) 65 - 99 mg/dL   Calcium, Ion 0.45 (L) 1.15 - 1.40 mmol/L   TCO2 27 22 - 32 mmol/L   Hemoglobin 13.9 12.0 - 15.0 g/dL   HCT 40.9 81.1 - 91.4 %  I-Stat CG4 Lactic Acid, ED     Status: None   Collection Time: 07/09/17  2:19 AM  Result Value Ref Range   Lactic Acid, Venous 1.52 0.5 - 1.9 mmol/L  Urinalysis, Routine w reflex microscopic     Status: Abnormal   Collection Time: 07/09/17  4:44 AM  Result Value Ref Range   Color, Urine YELLOW YELLOW   APPearance CLEAR CLEAR   Specific Gravity, Urine  1.014 1.005 - 1.030   pH 7.0 5.0 - 8.0   Glucose, UA NEGATIVE NEGATIVE mg/dL   Hgb urine dipstick SMALL (A) NEGATIVE   Bilirubin Urine NEGATIVE NEGATIVE   Ketones, ur NEGATIVE NEGATIVE mg/dL   Protein, ur NEGATIVE NEGATIVE mg/dL   Nitrite NEGATIVE NEGATIVE   Leukocytes, UA NEGATIVE NEGATIVE   RBC / HPF 0-5 0 - 5 RBC/hpf   WBC, UA 0-5 0 - 5 WBC/hpf   Bacteria, UA RARE (A) NONE SEEN   Squamous Epithelial / LPF 0-5 (A) NONE SEEN   Dg Chest 1 View  Result Date: 07/09/2017 CLINICAL DATA:  Initial evaluation for acute trauma, fall. EXAM: CHEST 1 VIEW COMPARISON:  Prior CT from earlier the same day. FINDINGS: Mild cardiomegaly. Mediastinal silhouette normal. Aortic atherosclerosis. Lungs normally inflated. No focal infiltrates. No pulmonary edema or appreciable pleural effusion. No pneumothorax. Acute fracture through the right humeral neck with impaction. No other acute osseous abnormality. IMPRESSION: 1. Acute fracture through the right humeral neck with impaction. 2. No other acute cardiopulmonary abnormality within the chest. 3. Mild cardiomegaly with aortic atherosclerosis. Electronically Signed   By: Rise Mu M.D.   On: 07/09/2017 04:35   Dg Shoulder Right  Result Date: 07/09/2017 CLINICAL DATA:  Initial evaluation for acute trauma, fall. EXAM: RIGHT SHOULDER - 2+ VIEW COMPARISON:  Prior CT from earlier same day. FINDINGS: There is an acute transverse fracture through the surgical neck of the right humerus with slight impaction. Humeral head is rotated and slightly  displaced laterally. Glenoid intact. Clavicle intact. Mild soft tissue swelling. Visualize right hemithorax is clear. IMPRESSION: Acute transverse fracture through the right humeral neck with impaction. Electronically Signed   By: Rise Mu M.D.   On: 07/09/2017 04:37   Ct Head Wo Contrast  Result Date: 07/09/2017 CLINICAL DATA:  Unwitnessed fall at home. EXAM: CT HEAD WITHOUT CONTRAST CT CERVICAL SPINE  WITHOUT CONTRAST TECHNIQUE: Multidetector CT imaging of the head and cervical spine was performed following the standard protocol without intravenous contrast. Multiplanar CT image reconstructions of the cervical spine were also generated. COMPARISON:  None. FINDINGS: CT HEAD FINDINGS Brain: Trace dependent hyperdensity in the posterior oral left lateral ventricle suspicious for small amount of intraventricular hemorrhage. Similar hyperdensity in the right appears contiguous with the choroid plexus may be additional tiny site of hemorrhage or related to the choroid. Ventricular dilatation likely secondary to atrophy. Thin right subdural hematoma measures up to 2 mm in thickness. No midline shift. Moderate periventricular white matter changes consistent with chronic small vessel ischemia. No evidence of acute ischemia. Vascular: Atherosclerosis of skullbase vasculature without hyperdense vessel or abnormal calcification. Skull: No fracture or focal lesion. Sinuses/Orbits: Paranasal sinuses and mastoid air cells are clear. The visualized orbits are unremarkable. Bilateral cataract resection. Other: Right frontal scalp hematoma. CT CERVICAL SPINE FINDINGS Alignment: Exaggerated cervical lordosis.  No traumatic subluxation. Skull base and vertebrae: The dens and skull base are intact. Cervical vertebral body heights are preserved. Mild loss of height of T1 vertebral body, moderate to severe T2 vertebral body, and mild T3 vertebral body. Degenerative pannus at the atlanto dens interval. Bones are under mineralized. Soft tissues and spinal canal: No prevertebral fluid or swelling. No visible canal hematoma. Disc levels: Disc space narrowing at C6-C7. Mild multilevel endplate spurring. Upper chest: Assessed on concurrent chest CT. Calcified granuloma at the left lung apex. Other: Carotid calcifications. IMPRESSION: 1. Thin acute right subdural hematoma measuring 2 mm. No midline shift. Small amount of intraventricular  blood in the left and right lateral ventricle. 2. Right frontal scalp hematoma without skull fracture. 3. Generally cerebral atrophy. Moderate chronic small vessel ischemia. 4. No acute fracture or subluxation of the cervical spine. 5. Compression fractures of upper thoracic vertebra, characterized on concurrent chest CT. Critical Value/emergent results were called by telephone at the time of interpretation on 07/09/2017 at 4:02 am to Dr. Glynn Octave , who verbally acknowledged these results. Electronically Signed   By: Rubye Oaks M.D.   On: 07/09/2017 04:02   Ct Chest W Contrast  Result Date: 07/09/2017 CLINICAL DATA:  Initial evaluation for acute trauma, unwitnessed fall. EXAM: CT CHEST, ABDOMEN, AND PELVIS WITH CONTRAST TECHNIQUE: Multidetector CT imaging of the chest, abdomen and pelvis was performed following the standard protocol during bolus administration of intravenous contrast. CONTRAST:  50mL ISOVUE-300 IOPAMIDOL (ISOVUE-300) INJECTION 61% COMPARISON:  None. FINDINGS: CT CHEST FINDINGS Cardiovascular: Intrathoracic aorta of normal caliber without aneurysm or other acute abnormality. Extensive atheromatous plaque throughout the arch and descending intrathoracic aorta. Partially visualized great vessels are tortuous but within normal limits. Mild cardiomegaly noted. Diffuse 3 vessel coronary artery calcifications. No pericardial effusion. Limited evaluation of the pulmonary arterial tree grossly unremarkable. Mediastinum/Nodes: Thyroid normal. No pathologically enlarged mediastinal, hilar, or axillary lymph nodes. Esophagus within normal limits. Lungs/Pleura: Tracheobronchial tree intact and patent. Small layering bilateral pleural effusions with associated atelectasis. Lungs are otherwise clear. No focal infiltrates. No pulmonary edema. No pneumothorax. No worrisome pulmonary nodule or mass. Musculoskeletal: There are compression deformities involving  the T2, T3, T6, T7, T8, and T9 vertebral  bodies, favored to be chronic in nature. Exaggeration of the normal thoracic kyphosis. No acute rib fracture. There is an acute fracture through the neck of the right humerus with impaction. Adjacent soft tissue swelling. No discrete lytic or blastic osseous lesions. CT ABDOMEN PELVIS FINDINGS Hepatobiliary: Subcentimeter hypodensity within left hepatic lobe noted, too small the characterize, but of doubtful significance. Liver otherwise unremarkable. Gallbladder surgically absent. Mild prominence of common bile duct like related age and post cholecystectomy changes. Pancreas: Pancreas within normal limits. Spleen: Spleen within normal limits. Adrenals/Urinary Tract: Adrenal glands are normal. Kidneys equal in size with symmetric enhancement. Small right renal cyst noted. No nephrolithiasis or hydronephrosis. No focal enhancing renal mass. No appreciable hydroureter. Bladder moderately distended without acute abnormality. Stomach/Bowel: Stomach within normal limits. No evidence for bowel obstruction or acute bowel injury. Large volume stool impacted within the rectal vault, consistent with constipation. No acute inflammatory changes seen about the bowels. Vascular/Lymphatic: Extensive atherosclerosis throughout the intra-abdominal aorta and its branch vessels. No aneurysm. Mesenteric vessels are patent proximally. No adenopathy. Reproductive: Uterus appears to be absent. Ovaries not discretely identified. Other: No free air or fluid. Musculoskeletal: External soft tissues within normal limits. There is an acute fracture through the right femoral neck. Bony pelvis otherwise intact. Compression deformity seen involving the L1 through L5 vertebral bodies, favored to be chronic. Sequelae of prior vertebral augmentation present at L3. No discrete lytic or blastic osseous lesions. IMPRESSION: 1. Acute fractures involving the right humeral neck and intertrochanteric left femur. 2. No other acute traumatic injury within the  chest, abdomen, and pelvis. 3. Multiple compression deformities throughout the thoracolumbar spine as above, favored to be chronic in nature. Correlation physical exam recommended. 4. Large volume stool within the colon, consistent with constipation. 5. Small layering bilateral pleural effusions with associated atelectasis. 6. Extensive atherosclerosis. Electronically Signed   By: Rise Mu M.D.   On: 07/09/2017 04:15   Ct Cervical Spine Wo Contrast  Result Date: 07/09/2017 CLINICAL DATA:  Unwitnessed fall at home. EXAM: CT HEAD WITHOUT CONTRAST CT CERVICAL SPINE WITHOUT CONTRAST TECHNIQUE: Multidetector CT imaging of the head and cervical spine was performed following the standard protocol without intravenous contrast. Multiplanar CT image reconstructions of the cervical spine were also generated. COMPARISON:  None. FINDINGS: CT HEAD FINDINGS Brain: Trace dependent hyperdensity in the posterior oral left lateral ventricle suspicious for small amount of intraventricular hemorrhage. Similar hyperdensity in the right appears contiguous with the choroid plexus may be additional tiny site of hemorrhage or related to the choroid. Ventricular dilatation likely secondary to atrophy. Thin right subdural hematoma measures up to 2 mm in thickness. No midline shift. Moderate periventricular white matter changes consistent with chronic small vessel ischemia. No evidence of acute ischemia. Vascular: Atherosclerosis of skullbase vasculature without hyperdense vessel or abnormal calcification. Skull: No fracture or focal lesion. Sinuses/Orbits: Paranasal sinuses and mastoid air cells are clear. The visualized orbits are unremarkable. Bilateral cataract resection. Other: Right frontal scalp hematoma. CT CERVICAL SPINE FINDINGS Alignment: Exaggerated cervical lordosis.  No traumatic subluxation. Skull base and vertebrae: The dens and skull base are intact. Cervical vertebral body heights are preserved. Mild loss of  height of T1 vertebral body, moderate to severe T2 vertebral body, and mild T3 vertebral body. Degenerative pannus at the atlanto dens interval. Bones are under mineralized. Soft tissues and spinal canal: No prevertebral fluid or swelling. No visible canal hematoma. Disc levels: Disc space narrowing at C6-C7.  Mild multilevel endplate spurring. Upper chest: Assessed on concurrent chest CT. Calcified granuloma at the left lung apex. Other: Carotid calcifications. IMPRESSION: 1. Thin acute right subdural hematoma measuring 2 mm. No midline shift. Small amount of intraventricular blood in the left and right lateral ventricle. 2. Right frontal scalp hematoma without skull fracture. 3. Generally cerebral atrophy. Moderate chronic small vessel ischemia. 4. No acute fracture or subluxation of the cervical spine. 5. Compression fractures of upper thoracic vertebra, characterized on concurrent chest CT. Critical Value/emergent results were called by telephone at the time of interpretation on 07/09/2017 at 4:02 am to Dr. Glynn OctaveSTEPHEN RANCOUR , who verbally acknowledged these results. Electronically Signed   By: Rubye OaksMelanie  Ehinger M.D.   On: 07/09/2017 04:02   Ct Abdomen Pelvis W Contrast  Result Date: 07/09/2017 CLINICAL DATA:  Initial evaluation for acute trauma, unwitnessed fall. EXAM: CT CHEST, ABDOMEN, AND PELVIS WITH CONTRAST TECHNIQUE: Multidetector CT imaging of the chest, abdomen and pelvis was performed following the standard protocol during bolus administration of intravenous contrast. CONTRAST:  50mL ISOVUE-300 IOPAMIDOL (ISOVUE-300) INJECTION 61% COMPARISON:  None. FINDINGS: CT CHEST FINDINGS Cardiovascular: Intrathoracic aorta of normal caliber without aneurysm or other acute abnormality. Extensive atheromatous plaque throughout the arch and descending intrathoracic aorta. Partially visualized great vessels are tortuous but within normal limits. Mild cardiomegaly noted. Diffuse 3 vessel coronary artery  calcifications. No pericardial effusion. Limited evaluation of the pulmonary arterial tree grossly unremarkable. Mediastinum/Nodes: Thyroid normal. No pathologically enlarged mediastinal, hilar, or axillary lymph nodes. Esophagus within normal limits. Lungs/Pleura: Tracheobronchial tree intact and patent. Small layering bilateral pleural effusions with associated atelectasis. Lungs are otherwise clear. No focal infiltrates. No pulmonary edema. No pneumothorax. No worrisome pulmonary nodule or mass. Musculoskeletal: There are compression deformities involving the T2, T3, T6, T7, T8, and T9 vertebral bodies, favored to be chronic in nature. Exaggeration of the normal thoracic kyphosis. No acute rib fracture. There is an acute fracture through the neck of the right humerus with impaction. Adjacent soft tissue swelling. No discrete lytic or blastic osseous lesions. CT ABDOMEN PELVIS FINDINGS Hepatobiliary: Subcentimeter hypodensity within left hepatic lobe noted, too small the characterize, but of doubtful significance. Liver otherwise unremarkable. Gallbladder surgically absent. Mild prominence of common bile duct like related age and post cholecystectomy changes. Pancreas: Pancreas within normal limits. Spleen: Spleen within normal limits. Adrenals/Urinary Tract: Adrenal glands are normal. Kidneys equal in size with symmetric enhancement. Small right renal cyst noted. No nephrolithiasis or hydronephrosis. No focal enhancing renal mass. No appreciable hydroureter. Bladder moderately distended without acute abnormality. Stomach/Bowel: Stomach within normal limits. No evidence for bowel obstruction or acute bowel injury. Large volume stool impacted within the rectal vault, consistent with constipation. No acute inflammatory changes seen about the bowels. Vascular/Lymphatic: Extensive atherosclerosis throughout the intra-abdominal aorta and its branch vessels. No aneurysm. Mesenteric vessels are patent proximally. No  adenopathy. Reproductive: Uterus appears to be absent. Ovaries not discretely identified. Other: No free air or fluid. Musculoskeletal: External soft tissues within normal limits. There is an acute fracture through the right femoral neck. Bony pelvis otherwise intact. Compression deformity seen involving the L1 through L5 vertebral bodies, favored to be chronic. Sequelae of prior vertebral augmentation present at L3. No discrete lytic or blastic osseous lesions. IMPRESSION: 1. Acute fractures involving the right humeral neck and intertrochanteric left femur. 2. No other acute traumatic injury within the chest, abdomen, and pelvis. 3. Multiple compression deformities throughout the thoracolumbar spine as above, favored to be chronic in nature. Correlation physical exam  recommended. 4. Large volume stool within the colon, consistent with constipation. 5. Small layering bilateral pleural effusions with associated atelectasis. 6. Extensive atherosclerosis. Electronically Signed   By: Rise MuBenjamin  McClintock M.D.   On: 07/09/2017 04:15   Dg Hip Unilat With Pelvis 2-3 Views Right  Result Date: 07/09/2017 CLINICAL DATA:  Initial evaluation for acute trauma, fall. EXAM: DG HIP (WITH OR WITHOUT PELVIS) 2-3V RIGHT COMPARISON:  Prior CT from earlier same day. FINDINGS: Acute nondisplaced intertrochanteric fracture of the right femur. Right femoral head remains normally aligned within the acetabulum. Femoral head height preserved. Bony pelvis intact. SI joints approximated. No acute osseus abnormality about the left hip. IV contrast material present within the bladder. Sequelae of prior vertebral augmentation present within the lower lumbar spine. IMPRESSION: Acute nondisplaced intertrochanteric fracture of the right femur. Electronically Signed   By: Rise MuBenjamin  McClintock M.D.   On: 07/09/2017 04:36    Assessment/Plan: Diagnosis: Right humeral neck fracture and right intertrochanteric hip fracture status post ORIF,  traumatic right subdural hemorrhage 1. Does the need for close, 24 hr/day medical supervision in concert with the patient's rehab needs make it unreasonable for this patient to be served in a less intensive setting? Potentially 2. Co-Morbidities requiring supervision/potential complications: Hypertension, history of gait disorder 3. Due to bladder management, bowel management, safety, skin/wound care, disease management, medication administration, pain management and patient education, does the patient require 24 hr/day rehab nursing? Potentially 4. Does the patient require coordinated care of a physician, rehab nurse, PT (1-2 hrs/day, 5 days/week) and OT (1-2 hrs/day, 5 days/week) to address physical and functional deficits in the context of the above medical diagnosis(es)? No Addressing deficits in the following areas: balance, endurance, locomotion, strength, transferring, bowel/bladder control, bathing, dressing, feeding, toileting and speech 5. Can the patient actively participate in an intensive therapy program of at least 3 hrs of therapy per day at least 5 days per week? No 6. The potential for patient to make measurable gains while on inpatient rehab is fair 7. Anticipated functional outcomes upon discharge from inpatient rehab are n/a  with PT, n/a with OT, n/a with SLP. 8. Estimated rehab length of stay to reach the above functional goals is: n/a 9. Anticipated D/C setting: Other 10. Anticipated post D/C treatments: N/A 11. Overall Rehab/Functional Prognosis: good and fair  RECOMMENDATIONS: This patient's condition is appropriate for continued rehabilitative care in the following setting: SNF Patient has agreed to participate in recommended program. Potentially Note that insurance prior authorization may be required for reimbursement for recommended care.  Comment: Rehab Admissions Coordinator to follow up.  Thanks,  Ranelle OysterZachary T. Calen Posch, MD, Earlie CountsFAAPMR    Love, Pamela S,  PA-C 07/09/2017

## 2017-07-09 NOTE — Consult Note (Signed)
Reason for Consult:[facility] Referring Physician: EDP  Misty RedbirdJoan [name] is an 82 y.o. female.   HPI:  82 year old female with dementia and osteoporosis and history of lumbar compression fractures managed by Dr. Yehuda [name] Alba who fell twice in the last couple days. She has suffered a right shoulder and right hip injury. She is being admitted by the trauma service. Orthopedics is involved. She denies headache. Unknown whether she lost consciousness. She may fall and hit her head on the table. She complains mostly of low back pain. No nausea or vomiting. On November in the room feel she is at her neurologic baseline.  Past Medical History:  Diagnosis Date  . Hypertension     History reviewed. No pertinent surgical history.  Allergies  Allergen Reactions  . Darvon [Propoxyphene] Other (See Comments)    Unknown allergic reaction  . Hydrocodone Nausea And Vomiting  . Lactose Other (See Comments)    Unknown allergic reaction  . Other Other (See Comments)    Unknown allergic reaction to sodium pentothal  . Sulfa Antibiotics Other (See Comments)    Unknown allergic reaction  . Valsartan Other (See Comments)    Unknown [facility] reaction [facility]    Social History   Tobacco Use  . Smoking status: Not on file  Substance Use Topics  . Alcohol use: Not on file    History reviewed. No pertinent family history.   Review of Systems  Positive ROS: Unable to obtain other than that above  All other systems have been reviewed and were otherwise negative with the exception of those mentioned in the HPI and as above.  Objective: Vital signs in last 24 hours: Temp:  [98.2 F (36.8 C)] 98.2 F (36.8 C) (12/17 0201) Pulse Rate:  [86-101] 100 (12/17 0715) Resp:  [16-22] 22 (12/17 0715) BP: (146-177)/(81-93) 146/88 (12/17 0715) SpO2:  [94 %-100 %] 100 % (12/17 0715)  General Appearance: Alert, cooperative, no distress, appears stated age Head: Normocephalic, without  obvious abnormality, right frontal abrasion and cephalohematoma Eyes: PERRL, conjunctiva/corneas clear, EOM's intact, Neck: Supple, symmetrical, trachea midline Lungs: respirations unlabored Heart: Irregular rhythm but not tachycardic Abdomen: Soft Extremities: Right shoulder in a sling and right leg in traction   NEUROLOGIC:   Mental status: A&O x4, no aphasia, good attention span, Memory and fund of knowledge are difficult to assess but appears she has some short-term memory issues and dementia Motor Exam - grossly normal, normal tone and bulk in the left, the right is difficult to assess because it is in traction or a sling Sensory Exam - grossly normal Reflexes: Okay on the left Coordination - grossly normal on the left Gait - unable to test Balance - unable to test Cranial Nerves: I: smell Not tested  II: visual acuity  OS: na    OD: na  II: visual fields Full to confrontation  II: pupils Equal, round, reactive to light  III,VII: ptosis None  III,IV,VI: extraocular muscles  Full ROM  V: mastication Normal  V: facial light touch sensation  Normal  V,VII: corneal reflex  Present  VII: facial muscle function - upper  Normal  VII: facial muscle function - lower Normal  VIII: hearing Not tested  IX: soft palate elevation  Normal  IX,X: gag reflex Present  XI: trapezius strength  5/5  XI: sternocleidomastoid strength 5/5  XI: neck flexion strength  5/5  XII: tongue strength  Normal    Data Review Lab Results  Component Value Date  WBC 10.5 07/09/2017   HGB 13.9 07/09/2017   HCT 41.0 07/09/2017   MCV 91.2 07/09/2017   PLT 333 07/09/2017   Lab Results  Component Value Date   NA 140 07/09/2017   K 3.2 (L) 07/09/2017   CL 102 07/09/2017   CO2 25 07/09/2017   BUN 19 07/09/2017   CREATININE 0.50 07/09/2017   GLUCOSE 156 (H) 07/09/2017   Lab Results  Component Value Date   INR 0.96 07/09/2017    Radiology: Dg Chest 1 View  Result Date: 07/09/2017 CLINICAL  DATA:  Initial evaluation for acute trauma, fall. EXAM: CHEST 1 VIEW COMPARISON:  Prior CT from earlier the same day. FINDINGS: Mild cardiomegaly. Mediastinal silhouette normal. Aortic atherosclerosis. Lungs normally inflated. No focal infiltrates. No pulmonary edema or appreciable pleural effusion. No pneumothorax. Acute fracture through the right humeral neck with impaction. No other acute osseous abnormality. IMPRESSION: 1. Acute fracture through the right humeral neck with impaction. 2. No other acute cardiopulmonary abnormality within the chest. 3. Mild cardiomegaly with aortic atherosclerosis. Electronically Signed   By: Rise Mu M.D.   On: 07/09/2017 04:35   Dg Shoulder Right  Result Date: 07/09/2017 CLINICAL DATA:  Initial evaluation for acute trauma, fall. EXAM: RIGHT SHOULDER - 2+ VIEW COMPARISON:  Prior CT from earlier same day. FINDINGS: There is an acute transverse fracture through the surgical neck of the right humerus with slight impaction. Humeral head is rotated and slightly displaced laterally. Glenoid intact. Clavicle intact. Mild soft tissue swelling. Visualize right hemithorax is clear. IMPRESSION: Acute transverse fracture through the right humeral neck with impaction. Electronically Signed   By: Rise Mu M.D.   On: 07/09/2017 04:37   Ct Head Wo Contrast  Result Date: 07/09/2017 CLINICAL DATA:  Unwitnessed fall at home. EXAM: CT HEAD WITHOUT CONTRAST CT CERVICAL SPINE WITHOUT CONTRAST TECHNIQUE: Multidetector CT imaging of the head and cervical spine was performed following the standard protocol without intravenous contrast. Multiplanar CT image reconstructions of the cervical spine were also generated. COMPARISON:  None. FINDINGS: CT HEAD FINDINGS Brain: Trace dependent hyperdensity in the posterior oral left lateral ventricle suspicious for small amount of intraventricular hemorrhage. Similar hyperdensity in the right appears contiguous with the choroid  plexus may be additional tiny site of hemorrhage or related to the choroid. Ventricular dilatation likely secondary to atrophy. Thin right subdural hematoma measures up to 2 mm in thickness. No midline shift. Moderate periventricular white matter changes consistent with chronic small vessel ischemia. No evidence of acute ischemia. Vascular: Atherosclerosis of skullbase vasculature without hyperdense vessel or abnormal calcification. Skull: No fracture or focal lesion. Sinuses/Orbits: Paranasal sinuses and mastoid air cells are clear. The visualized orbits are unremarkable. Bilateral cataract resection. Other: Right frontal scalp hematoma. CT CERVICAL SPINE FINDINGS Alignment: Exaggerated cervical lordosis.  No traumatic subluxation. Skull base and vertebrae: The dens and skull base are intact. Cervical vertebral body heights are preserved. Mild loss of height of T1 vertebral body, moderate to severe T2 vertebral body, and mild T3 vertebral body. Degenerative pannus at the atlanto dens interval. Bones are under mineralized. Soft tissues and spinal canal: No prevertebral fluid or swelling. No visible canal hematoma. Disc levels: Disc space narrowing at C6-C7. Mild multilevel endplate spurring. Upper chest: Assessed on concurrent chest CT. Calcified granuloma at the left lung apex. Other: Carotid calcifications. IMPRESSION: 1. Thin acute right subdural hematoma measuring 2 mm. No midline shift. Small amount of intraventricular blood in the left and right lateral ventricle. 2. Right frontal scalp  hematoma without skull fracture. 3. Generally cerebral atrophy. Moderate chronic small vessel ischemia. 4. No acute fracture or subluxation of the cervical spine. 5. Compression fractures of upper thoracic vertebra, characterized on concurrent chest CT. Critical Value/emergent results were called by telephone at the time of interpretation on 07/09/2017 at 4:02 am to Dr. Glynn Octave , who verbally acknowledged these results.  Electronically Signed   By: Rubye Oaks M.D.   On: 07/09/2017 04:02   Ct Chest W Contrast  Result Date: 07/09/2017 CLINICAL DATA:  Initial evaluation for acute trauma, unwitnessed fall. EXAM: CT CHEST, ABDOMEN, AND PELVIS WITH CONTRAST TECHNIQUE: Multidetector CT imaging of the chest, abdomen and pelvis was performed following the standard protocol during bolus administration of intravenous contrast. CONTRAST:  50mL ISOVUE-300 IOPAMIDOL (ISOVUE-300) INJECTION 61% COMPARISON:  None. FINDINGS: CT CHEST FINDINGS Cardiovascular: Intrathoracic aorta of normal caliber without aneurysm or other acute abnormality. Extensive atheromatous plaque throughout the arch and descending intrathoracic aorta. Partially visualized great vessels are tortuous but within normal limits. Mild cardiomegaly noted. Diffuse 3 vessel coronary artery calcifications. No pericardial effusion. Limited evaluation of the pulmonary arterial tree grossly unremarkable. Mediastinum/Nodes: Thyroid normal. No pathologically enlarged mediastinal, hilar, or axillary lymph nodes. Esophagus within normal limits. Lungs/Pleura: Tracheobronchial tree intact and patent. Small layering bilateral pleural effusions with associated atelectasis. Lungs are otherwise clear. No focal infiltrates. No pulmonary edema. No pneumothorax. No worrisome pulmonary nodule or mass. Musculoskeletal: There are compression deformities involving the T2, T3, T6, T7, T8, and T9 vertebral bodies, favored to be chronic in nature. Exaggeration of the normal thoracic kyphosis. No acute rib fracture. There is an acute fracture through the neck of the right humerus with impaction. Adjacent soft tissue swelling. No discrete lytic or blastic osseous lesions. CT ABDOMEN PELVIS FINDINGS Hepatobiliary: Subcentimeter hypodensity within left hepatic lobe noted, too small the characterize, but of doubtful significance. Liver otherwise unremarkable. Gallbladder surgically absent. Mild  prominence of common bile duct like related age and post cholecystectomy changes. Pancreas: Pancreas within normal limits. Spleen: Spleen within normal limits. Adrenals/Urinary Tract: Adrenal glands are normal. Kidneys equal in size with symmetric enhancement. Small right renal cyst noted. No nephrolithiasis or hydronephrosis. No focal enhancing renal mass. No appreciable hydroureter. Bladder moderately distended without acute abnormality. Stomach/Bowel: Stomach within normal limits. No evidence for bowel obstruction or acute bowel injury. Large volume stool impacted within the rectal vault, consistent with constipation. No acute inflammatory changes seen about the bowels. Vascular/Lymphatic: Extensive atherosclerosis throughout the intra-abdominal aorta and its branch vessels. No aneurysm. Mesenteric vessels are patent proximally. No adenopathy. Reproductive: Uterus appears to be absent. Ovaries not discretely identified. Other: No free air or fluid. Musculoskeletal: External soft tissues within normal limits. There is an acute fracture through the right femoral neck. Bony pelvis otherwise intact. Compression deformity seen involving the L1 through L5 vertebral bodies, favored to be chronic. Sequelae of prior vertebral augmentation present at L3. No discrete lytic or blastic osseous lesions. IMPRESSION: 1. Acute fractures involving the right humeral neck and intertrochanteric left femur. 2. No other acute traumatic injury within the chest, abdomen, and pelvis. 3. Multiple compression deformities throughout the thoracolumbar spine as above, favored to be chronic in nature. Correlation physical exam recommended. 4. Large volume stool within the colon, consistent with constipation. 5. Small layering bilateral pleural effusions with associated atelectasis. 6. Extensive atherosclerosis. Electronically Signed   By: Rise Mu M.D.   On: 07/09/2017 04:15   Ct Cervical Spine Wo Contrast  Result Date:  07/09/2017 CLINICAL DATA:  Unwitnessed fall at home. EXAM: CT HEAD WITHOUT CONTRAST CT CERVICAL SPINE WITHOUT CONTRAST TECHNIQUE: Multidetector CT imaging of the head and cervical spine was performed following the standard protocol without intravenous contrast. Multiplanar CT image reconstructions of the cervical spine were also generated. COMPARISON:  None. FINDINGS: CT HEAD FINDINGS Brain: Trace dependent hyperdensity in the posterior oral left lateral ventricle suspicious for small amount of intraventricular hemorrhage. Similar hyperdensity in the right appears contiguous with the choroid plexus may be additional tiny site of hemorrhage or related to the choroid. Ventricular dilatation likely secondary to atrophy. Thin right subdural hematoma measures up to 2 mm in thickness. No midline shift. Moderate periventricular white matter changes consistent with chronic small vessel ischemia. No evidence of acute ischemia. Vascular: Atherosclerosis of skullbase vasculature without hyperdense vessel or abnormal calcification. Skull: No fracture or focal lesion. Sinuses/Orbits: Paranasal sinuses and mastoid air cells are clear. The visualized orbits are unremarkable. Bilateral cataract resection. Other: Right frontal scalp hematoma. CT CERVICAL SPINE FINDINGS Alignment: Exaggerated cervical lordosis.  No traumatic subluxation. Skull base and vertebrae: The dens and skull base are intact. Cervical vertebral body heights are preserved. Mild loss of height of T1 vertebral body, moderate to severe T2 vertebral body, and mild T3 vertebral body. Degenerative pannus at the atlanto dens interval. Bones are under mineralized. Soft tissues and spinal canal: No prevertebral fluid or swelling. No visible canal hematoma. Disc levels: Disc space narrowing at C6-C7. Mild multilevel endplate spurring. Upper chest: Assessed on concurrent chest CT. Calcified granuloma at the left lung apex. Other: Carotid calcifications. IMPRESSION: 1.  Thin acute right subdural hematoma measuring 2 mm. No midline shift. Small amount of intraventricular blood in the left and right lateral ventricle. 2. Right frontal scalp hematoma without skull fracture. 3. Generally cerebral atrophy. Moderate chronic small vessel ischemia. 4. No acute fracture or subluxation of the cervical spine. 5. Compression fractures of upper thoracic vertebra, characterized on concurrent chest CT. Critical Value/emergent results were called by telephone at the time of interpretation on 07/09/2017 at 4:02 am to Dr. Glynn OctaveSTEPHEN RANCOUR , who verbally acknowledged these results. Electronically Signed   By: Rubye OaksMelanie  Ehinger M.D.   On: 07/09/2017 04:02   Ct Abdomen Pelvis W Contrast  Result Date: 07/09/2017 CLINICAL DATA:  Initial evaluation for acute trauma, unwitnessed fall. EXAM: CT CHEST, ABDOMEN, AND PELVIS WITH CONTRAST TECHNIQUE: Multidetector CT imaging of the chest, abdomen and pelvis was performed following the standard protocol during bolus administration of intravenous contrast. CONTRAST:  50mL ISOVUE-300 IOPAMIDOL (ISOVUE-300) INJECTION 61% COMPARISON:  None. FINDINGS: CT CHEST FINDINGS Cardiovascular: Intrathoracic aorta of normal caliber without aneurysm or other acute abnormality. Extensive atheromatous plaque throughout the arch and descending intrathoracic aorta. Partially visualized great vessels are tortuous but within normal limits. Mild cardiomegaly noted. Diffuse 3 vessel coronary artery calcifications. No pericardial effusion. Limited evaluation of the pulmonary arterial tree grossly unremarkable. Mediastinum/Nodes: Thyroid normal. No pathologically enlarged mediastinal, hilar, or axillary lymph nodes. Esophagus within normal limits. Lungs/Pleura: Tracheobronchial tree intact and patent. Small layering bilateral pleural effusions with associated atelectasis. Lungs are otherwise clear. No focal infiltrates. No pulmonary edema. No pneumothorax. No worrisome pulmonary nodule  or mass. Musculoskeletal: There are compression deformities involving the T2, T3, T6, T7, T8, and T9 vertebral bodies, favored to be chronic in nature. Exaggeration of the normal thoracic kyphosis. No acute rib fracture. There is an acute fracture through the neck of the right humerus with impaction. Adjacent soft tissue swelling. No discrete lytic or blastic osseous lesions. CT  ABDOMEN PELVIS FINDINGS Hepatobiliary: Subcentimeter hypodensity within left hepatic lobe noted, too small the characterize, but of doubtful significance. Liver otherwise unremarkable. Gallbladder surgically absent. Mild prominence of common bile duct like related age and post cholecystectomy changes. Pancreas: Pancreas within normal limits. Spleen: Spleen within normal limits. Adrenals/Urinary Tract: Adrenal glands are normal. Kidneys equal in size with symmetric enhancement. Small right renal cyst noted. No nephrolithiasis or hydronephrosis. No focal enhancing renal mass. No appreciable hydroureter. Bladder moderately distended without acute abnormality. Stomach/Bowel: Stomach within normal limits. No evidence for bowel obstruction or acute bowel injury. Large volume stool impacted within the rectal vault, consistent with constipation. No acute inflammatory changes seen about the bowels. Vascular/Lymphatic: Extensive atherosclerosis throughout the intra-abdominal aorta and its branch vessels. No aneurysm. Mesenteric vessels are patent proximally. No adenopathy. Reproductive: Uterus appears to be absent. Ovaries not discretely identified. Other: No free air or fluid. Musculoskeletal: External soft tissues within normal limits. There is an acute fracture through the right femoral neck. Bony pelvis otherwise intact. Compression deformity seen involving the L1 through L5 vertebral bodies, favored to be chronic. Sequelae of prior vertebral augmentation present at L3. No discrete lytic or blastic osseous lesions. IMPRESSION: 1. Acute fractures  involving the right humeral neck and intertrochanteric left femur. 2. No other acute traumatic injury within the chest, abdomen, and pelvis. 3. Multiple compression deformities throughout the thoracolumbar spine as above, favored to be chronic in nature. Correlation physical exam recommended. 4. Large volume stool within the colon, consistent with constipation. 5. Small layering bilateral pleural effusions with associated atelectasis. 6. Extensive atherosclerosis. Electronically Signed   By: Rise Mu M.D.   On: 07/09/2017 04:15   Dg Hip Unilat With Pelvis 2-3 Views Right  Result Date: 07/09/2017 CLINICAL DATA:  Initial evaluation for acute trauma, fall. EXAM: DG HIP (WITH OR WITHOUT PELVIS) 2-3V RIGHT COMPARISON:  Prior CT from earlier same day. FINDINGS: Acute nondisplaced intertrochanteric fracture of the right femur. Right femoral head remains normally aligned within the acetabulum. Femoral head height preserved. Bony pelvis intact. SI joints approximated. No acute osseus abnormality about the left hip. IV contrast material present within the bladder. Sequelae of prior vertebral augmentation present within the lower lumbar spine. IMPRESSION: Acute nondisplaced intertrochanteric fracture of the right femur. Electronically Signed   By: Rise Mu M.D.   On: 07/09/2017 04:36     Assessment/Plan: Tiny hyperdensity in the right temporoparietal region that is likely a small amount of traumatic subdural blood without mass effect or shift, very small volume intraventricular blood layering in the occipital horns. These are likely clinically insignificant findings and require no surgical intervention. She does have a concussion and should be treated with concussion protocol. Repeat CT scan if she has any decline in mental status or change in neurologic status. Would limit anticoagulants for 5 days if possible. Please cough I be of further assistance or if there is any change in her  neurologic status   Kayler Buckholtz S 07/09/2017 7:45 AM

## 2017-07-09 NOTE — Progress Notes (Signed)
Orthopedic Tech Progress Note Patient Details:  Misty [name] 01/21/1935 [id number]  Musculoskeletal Traction Type of Traction: Bucks Skin Traction Traction Location: rle Traction Weight: 5 lbs   Post Interventions Patient Tolerated: Well Instructions Provided: Care of device   [name], Lanae Federer J 07/09/2017, 7:31 AM

## 2017-07-09 NOTE — H&P (View-Only) (Signed)
Patient ID: Misty [name] MRN: [id number] DOB/AGE: 80/08/1934 80 y.o.  Admit date: 07/09/2017  Admission Diagnoses:  Active Problems:   Fall from standing   HPI: Patient being seen at the request of trauma service for right proximal humerus fracture and right hip IT fracture. Patient states that she fell late last night and suffered multiple injuries. She has history of multiple falls. Imaging studies also show audible thoracic and lumbar compression fractures that were felt to be chronic. Currently complaining of right shoulder and right hip pain. CT head showed a right subdural hematoma measuring about 2 mm. She has been seen by Dr. Yetta BarreJones with neurosurgery. Family members are not present during my visit.  Past Medical History: Past Medical History:  Diagnosis Date  . Hypertension     Surgical History: History reviewed. No pertinent surgical history.  Family History: History reviewed. No pertinent family history.  Social History: Social History   Socioeconomic History  . Marital status: Widowed    Spouse name: Not on file  . Number of children: Not on file  . Years of education: Not on file  . Highest education level: Not on file  Social Needs  . Financial resource strain: Not on file  . Food insecurity - worry: Not on file  . Food insecurity - inability: Not on file  . Transportation needs - medical: Not on file  . Transportation needs - non-medical: Not on file  Occupational History  . Not on file  Tobacco Use  . Smoking status: Not on file  Substance and Sexual Activity  . Alcohol use: Not on file  . Drug use: Not on file  . Sexual activity: Not on file  Other Topics Concern  . Not on file  Social History Narrative  . Not on file    Allergies: Darvon [propoxyphene]; Hydrocodone; Lactose; Other; Sulfa antibiotics; and Valsartan  Medications: I have reviewed the patient's current medications.  Vital Signs: Patient Vitals for the past 24 hrs:  BP  Temp Temp src Pulse Resp SpO2  07/09/17 0838 (!) 162/80 98.2 F (36.8 C) Oral 94 18 91 %  07/09/17 0745 (!) 155/88 - - 93 18 100 %  07/09/17 0715 (!) 146/88 - - 100 (!) 22 100 %  07/09/17 0645 (!) 155/82 - - 86 20 100 %  07/09/17 0630 (!) 158/84 - - 87 20 100 %  07/09/17 0530 (!) 159/83 - - 89 16 94 %  07/09/17 0500 (!) 170/88 - - 98 18 100 %  07/09/17 0430 (!) 163/84 - - 87 16 100 %  07/09/17 0400 (!) 177/91 - - 96 - 97 %  07/09/17 0330 (!) 168/89 - - 94 - 95 %  07/09/17 0300 (!) 170/93 - - (!) 101 18 100 %  07/09/17 0215 (!) 166/85 - - 88 18 100 %  07/09/17 0201 (!) 165/81 98.2 F (36.8 C) Oral 89 18 97 %  07/09/17 0155 - - - - - 98 %    Radiology: Dg Chest 1 View  Result Date: 07/09/2017 CLINICAL DATA:  Initial evaluation for acute trauma, fall. EXAM: CHEST 1 VIEW COMPARISON:  Prior CT from earlier the same day. FINDINGS: Mild cardiomegaly. Mediastinal silhouette normal. Aortic atherosclerosis. Lungs normally inflated. No focal infiltrates. No pulmonary edema or appreciable pleural effusion. No pneumothorax. Acute fracture through the right humeral neck with impaction. No other acute osseous abnormality. IMPRESSION: 1. Acute fracture through the right humeral neck with impaction. 2. No other acute cardiopulmonary abnormality within  the chest. 3. Mild cardiomegaly with aortic atherosclerosis. Electronically Signed   By: Rise MuBenjamin  McClintock M.D.   On: 07/09/2017 04:35   Dg Shoulder Right  Result Date: 07/09/2017 CLINICAL DATA:  Initial evaluation for acute trauma, fall. EXAM: RIGHT SHOULDER - 2+ VIEW COMPARISON:  Prior CT from earlier same day. FINDINGS: There is an acute transverse fracture through the surgical neck of the right humerus with slight impaction. Humeral head is rotated and slightly displaced laterally. Glenoid intact. Clavicle intact. Mild soft tissue swelling. Visualize right hemithorax is clear. IMPRESSION: Acute transverse fracture through the right humeral neck with  impaction. Electronically Signed   By: Rise MuBenjamin  McClintock M.D.   On: 07/09/2017 04:37   Ct Head Wo Contrast  Result Date: 07/09/2017 CLINICAL DATA:  Unwitnessed fall at home. EXAM: CT HEAD WITHOUT CONTRAST CT CERVICAL SPINE WITHOUT CONTRAST TECHNIQUE: Multidetector CT imaging of the head and cervical spine was performed following the standard protocol without intravenous contrast. Multiplanar CT image reconstructions of the cervical spine were also generated. COMPARISON:  None. FINDINGS: CT HEAD FINDINGS Brain: Trace dependent hyperdensity in the posterior oral left lateral ventricle suspicious for small amount of intraventricular hemorrhage. Similar hyperdensity in the right appears contiguous with the choroid plexus may be additional tiny site of hemorrhage or related to the choroid. Ventricular dilatation likely secondary to atrophy. Thin right subdural hematoma measures up to 2 mm in thickness. No midline shift. Moderate periventricular white matter changes consistent with chronic small vessel ischemia. No evidence of acute ischemia. Vascular: Atherosclerosis of skullbase vasculature without hyperdense vessel or abnormal calcification. Skull: No fracture or focal lesion. Sinuses/Orbits: Paranasal sinuses and mastoid air cells are clear. The visualized orbits are unremarkable. Bilateral cataract resection. Other: Right frontal scalp hematoma. CT CERVICAL SPINE FINDINGS Alignment: Exaggerated cervical lordosis.  No traumatic subluxation. Skull base and vertebrae: The dens and skull base are intact. Cervical vertebral body heights are preserved. Mild loss of height of T1 vertebral body, moderate to severe T2 vertebral body, and mild T3 vertebral body. Degenerative pannus at the atlanto dens interval. Bones are under mineralized. Soft tissues and spinal canal: No prevertebral fluid or swelling. No visible canal hematoma. Disc levels: Disc space narrowing at C6-C7. Mild multilevel endplate spurring. Upper  chest: Assessed on concurrent chest CT. Calcified granuloma at the left lung apex. Other: Carotid calcifications. IMPRESSION: 1. Thin acute right subdural hematoma measuring 2 mm. No midline shift. Small amount of intraventricular blood in the left and right lateral ventricle. 2. Right frontal scalp hematoma without skull fracture. 3. Generally cerebral atrophy. Moderate chronic small vessel ischemia. 4. No acute fracture or subluxation of the cervical spine. 5. Compression fractures of upper thoracic vertebra, characterized on concurrent chest CT. Critical Value/emergent results were called by telephone at the time of interpretation on 07/09/2017 at 4:02 am to Dr. Glynn OctaveSTEPHEN RANCOUR , who verbally acknowledged these results. Electronically Signed   By: Rubye OaksMelanie  Ehinger M.D.   On: 07/09/2017 04:02   Ct Chest W Contrast  Result Date: 07/09/2017 CLINICAL DATA:  Initial evaluation for acute trauma, unwitnessed fall. EXAM: CT CHEST, ABDOMEN, AND PELVIS WITH CONTRAST TECHNIQUE: Multidetector CT imaging of the chest, abdomen and pelvis was performed following the standard protocol during bolus administration of intravenous contrast. CONTRAST:  50mL ISOVUE-300 IOPAMIDOL (ISOVUE-300) INJECTION 61% COMPARISON:  None. FINDINGS: CT CHEST FINDINGS Cardiovascular: Intrathoracic aorta of normal caliber without aneurysm or other acute abnormality. Extensive atheromatous plaque throughout the arch and descending intrathoracic aorta. Partially visualized great vessels are tortuous but  within normal limits. Mild cardiomegaly noted. Diffuse 3 vessel coronary artery calcifications. No pericardial effusion. Limited evaluation of the pulmonary arterial tree grossly unremarkable. Mediastinum/Nodes: Thyroid normal. No pathologically enlarged mediastinal, hilar, or axillary lymph nodes. Esophagus within normal limits. Lungs/Pleura: Tracheobronchial tree intact and patent. Small layering bilateral pleural effusions with associated  atelectasis. Lungs are otherwise clear. No focal infiltrates. No pulmonary edema. No pneumothorax. No worrisome pulmonary nodule or mass. Musculoskeletal: There are compression deformities involving the T2, T3, T6, T7, T8, and T9 vertebral bodies, favored to be chronic in nature. Exaggeration of the normal thoracic kyphosis. No acute rib fracture. There is an acute fracture through the neck of the right humerus with impaction. Adjacent soft tissue swelling. No discrete lytic or blastic osseous lesions. CT ABDOMEN PELVIS FINDINGS Hepatobiliary: Subcentimeter hypodensity within left hepatic lobe noted, too small the characterize, but of doubtful significance. Liver otherwise unremarkable. Gallbladder surgically absent. Mild prominence of common bile duct like related age and post cholecystectomy changes. Pancreas: Pancreas within normal limits. Spleen: Spleen within normal limits. Adrenals/Urinary Tract: Adrenal glands are normal. Kidneys equal in size with symmetric enhancement. Small right renal cyst noted. No nephrolithiasis or hydronephrosis. No focal enhancing renal mass. No appreciable hydroureter. Bladder moderately distended without acute abnormality. Stomach/Bowel: Stomach within normal limits. No evidence for bowel obstruction or acute bowel injury. Large volume stool impacted within the rectal vault, consistent with constipation. No acute inflammatory changes seen about the bowels. Vascular/Lymphatic: Extensive atherosclerosis throughout the intra-abdominal aorta and its branch vessels. No aneurysm. Mesenteric vessels are patent proximally. No adenopathy. Reproductive: Uterus appears to be absent. Ovaries not discretely identified. Other: No free air or fluid. Musculoskeletal: External soft tissues within normal limits. There is an acute fracture through the right femoral neck. Bony pelvis otherwise intact. Compression deformity seen involving the L1 through L5 vertebral bodies, favored to be chronic.  Sequelae of prior vertebral augmentation present at L3. No discrete lytic or blastic osseous lesions. IMPRESSION: 1. Acute fractures involving the right humeral neck and intertrochanteric left femur. 2. No other acute traumatic injury within the chest, abdomen, and pelvis. 3. Multiple compression deformities throughout the thoracolumbar spine as above, favored to be chronic in nature. Correlation physical exam recommended. 4. Large volume stool within the colon, consistent with constipation. 5. Small layering bilateral pleural effusions with associated atelectasis. 6. Extensive atherosclerosis. Electronically Signed   By: Rise Mu M.D.   On: 07/09/2017 04:15   Ct Cervical Spine Wo Contrast  Result Date: 07/09/2017 CLINICAL DATA:  Unwitnessed fall at home. EXAM: CT HEAD WITHOUT CONTRAST CT CERVICAL SPINE WITHOUT CONTRAST TECHNIQUE: Multidetector CT imaging of the head and cervical spine was performed following the standard protocol without intravenous contrast. Multiplanar CT image reconstructions of the cervical spine were also generated. COMPARISON:  None. FINDINGS: CT HEAD FINDINGS Brain: Trace dependent hyperdensity in the posterior oral left lateral ventricle suspicious for small amount of intraventricular hemorrhage. Similar hyperdensity in the right appears contiguous with the choroid plexus may be additional tiny site of hemorrhage or related to the choroid. Ventricular dilatation likely secondary to atrophy. Thin right subdural hematoma measures up to 2 mm in thickness. No midline shift. Moderate periventricular white matter changes consistent with chronic small vessel ischemia. No evidence of acute ischemia. Vascular: Atherosclerosis of skullbase vasculature without hyperdense vessel or abnormal calcification. Skull: No fracture or focal lesion. Sinuses/Orbits: Paranasal sinuses and mastoid air cells are clear. The visualized orbits are unremarkable. Bilateral cataract resection. Other:  Right frontal scalp hematoma. CT CERVICAL  SPINE FINDINGS Alignment: Exaggerated cervical lordosis.  No traumatic subluxation. Skull base and vertebrae: The dens and skull base are intact. Cervical vertebral body heights are preserved. Mild loss of height of T1 vertebral body, moderate to severe T2 vertebral body, and mild T3 vertebral body. Degenerative pannus at the atlanto dens interval. Bones are under mineralized. Soft tissues and spinal canal: No prevertebral fluid or swelling. No visible canal hematoma. Disc levels: Disc space narrowing at C6-C7. Mild multilevel endplate spurring. Upper chest: Assessed on concurrent chest CT. Calcified granuloma at the left lung apex. Other: Carotid calcifications. IMPRESSION: 1. Thin acute right subdural hematoma measuring 2 mm. No midline shift. Small amount of intraventricular blood in the left and right lateral ventricle. 2. Right frontal scalp hematoma without skull fracture. 3. Generally cerebral atrophy. Moderate chronic small vessel ischemia. 4. No acute fracture or subluxation of the cervical spine. 5. Compression fractures of upper thoracic vertebra, characterized on concurrent chest CT. Critical Value/emergent results were called by telephone at the time of interpretation on 07/09/2017 at 4:02 am to Dr. Glynn OctaveSTEPHEN RANCOUR , who verbally acknowledged these results. Electronically Signed   By: Rubye OaksMelanie  Ehinger M.D.   On: 07/09/2017 04:02   Ct Abdomen Pelvis W Contrast  Result Date: 07/09/2017 CLINICAL DATA:  Initial evaluation for acute trauma, unwitnessed fall. EXAM: CT CHEST, ABDOMEN, AND PELVIS WITH CONTRAST TECHNIQUE: Multidetector CT imaging of the chest, abdomen and pelvis was performed following the standard protocol during bolus administration of intravenous contrast. CONTRAST:  50mL ISOVUE-300 IOPAMIDOL (ISOVUE-300) INJECTION 61% COMPARISON:  None. FINDINGS: CT CHEST FINDINGS Cardiovascular: Intrathoracic aorta of normal caliber without aneurysm or other  acute abnormality. Extensive atheromatous plaque throughout the arch and descending intrathoracic aorta. Partially visualized great vessels are tortuous but within normal limits. Mild cardiomegaly noted. Diffuse 3 vessel coronary artery calcifications. No pericardial effusion. Limited evaluation of the pulmonary arterial tree grossly unremarkable. Mediastinum/Nodes: Thyroid normal. No pathologically enlarged mediastinal, hilar, or axillary lymph nodes. Esophagus within normal limits. Lungs/Pleura: Tracheobronchial tree intact and patent. Small layering bilateral pleural effusions with associated atelectasis. Lungs are otherwise clear. No focal infiltrates. No pulmonary edema. No pneumothorax. No worrisome pulmonary nodule or mass. Musculoskeletal: There are compression deformities involving the T2, T3, T6, T7, T8, and T9 vertebral bodies, favored to be chronic in nature. Exaggeration of the normal thoracic kyphosis. No acute rib fracture. There is an acute fracture through the neck of the right humerus with impaction. Adjacent soft tissue swelling. No discrete lytic or blastic osseous lesions. CT ABDOMEN PELVIS FINDINGS Hepatobiliary: Subcentimeter hypodensity within left hepatic lobe noted, too small the characterize, but of doubtful significance. Liver otherwise unremarkable. Gallbladder surgically absent. Mild prominence of common bile duct like related age and post cholecystectomy changes. Pancreas: Pancreas within normal limits. Spleen: Spleen within normal limits. Adrenals/Urinary Tract: Adrenal glands are normal. Kidneys equal in size with symmetric enhancement. Small right renal cyst noted. No nephrolithiasis or hydronephrosis. No focal enhancing renal mass. No appreciable hydroureter. Bladder moderately distended without acute abnormality. Stomach/Bowel: Stomach within normal limits. No evidence for bowel obstruction or acute bowel injury. Large volume stool impacted within the rectal vault, consistent with  constipation. No acute inflammatory changes seen about the bowels. Vascular/Lymphatic: Extensive atherosclerosis throughout the intra-abdominal aorta and its branch vessels. No aneurysm. Mesenteric vessels are patent proximally. No adenopathy. Reproductive: Uterus appears to be absent. Ovaries not discretely identified. Other: No free air or fluid. Musculoskeletal: External soft tissues within normal limits. There is an acute fracture through the right femoral  neck. Bony pelvis otherwise intact. Compression deformity seen involving the L1 through L5 vertebral bodies, favored to be chronic. Sequelae of prior vertebral augmentation present at L3. No discrete lytic or blastic osseous lesions. IMPRESSION: 1. Acute fractures involving the right humeral neck and intertrochanteric left femur. 2. No other acute traumatic injury within the chest, abdomen, and pelvis. 3. Multiple compression deformities throughout the thoracolumbar spine as above, favored to be chronic in nature. Correlation physical exam recommended. 4. Large volume stool within the colon, consistent with constipation. 5. Small layering bilateral pleural effusions with associated atelectasis. 6. Extensive atherosclerosis. Electronically Signed   By: Rise Mu M.D.   On: 07/09/2017 04:15   Dg Hip Unilat With Pelvis 2-3 Views Right  Result Date: 07/09/2017 CLINICAL DATA:  Initial evaluation for acute trauma, fall. EXAM: DG HIP (WITH OR WITHOUT PELVIS) 2-3V RIGHT COMPARISON:  Prior CT from earlier same day. FINDINGS: Acute nondisplaced intertrochanteric fracture of the right femur. Right femoral head remains normally aligned within the acetabulum. Femoral head height preserved. Bony pelvis intact. SI joints approximated. No acute osseus abnormality about the left hip. IV contrast material present within the bladder. Sequelae of prior vertebral augmentation present within the lower lumbar spine. IMPRESSION: Acute nondisplaced intertrochanteric  fracture of the right femur. Electronically Signed   By: Rise Mu M.D.   On: 07/09/2017 04:36    Labs: Recent Labs    07/09/17 0212 07/09/17 0216  WBC 10.5  --   RBC 4.33  --   HCT 39.5 41.0  PLT 333  --    Recent Labs    07/09/17 0212 07/09/17 0216  NA 137 140  K 3.1* 3.2*  CL 102 102  CO2 25  --   BUN 17 19  CREATININE 0.53 0.50  GLUCOSE 156* 156*  CALCIUM 8.7*  --    Recent Labs    07/09/17 0212  INR 0.96    Review of Systems: Review of Systems  Respiratory: Negative.   Cardiovascular: Negative.   Musculoskeletal: Positive for falls and joint pain.  Neurological:       Head pain secondary to trauma with  forehead hematoma  Psychiatric/Behavioral: Positive for memory loss.       Possible history of dementia    Physical Exam: Pleasant elderly white female. Family members are present during my visit. Some questions patient appears to answer appropriately. I'm not sure of her baseline. Right shoulder sling on along with right lower extremity Buck's traction. She is neurovascularly intact. Right shoulder and hip pain with slight movement. No respiratory distress. Bilateral calves are nontender.  Assessment and Plan: Right proximal humerus fracture Right hip IT fracture Right subdural hematoma  Dr. Ophelia Charter and I reviewed patient imaging studies. Will order CT right shoulder. Plan would be ORIF right hip when she is cleared from neurosurgery and trauma service.  We'll make decisions regarding right shoulder after we have reviewed the CT scan.  Continue right shoulder sling and Buck's traction.   Genene Churn. Barry Dienes PA-C For Annell Greening MD [facility]                 Addendum by Ophelia Charter. Talked with patients son who has POA. Will plan on hip fixation trochanteric nail on Wednesday afternoon. Awaiting CT of shoulder but probable closed Tx of that injury . Will post for Surgery Wednesday afternoon and proceed unless status changes.  My cell [phone]

## 2017-07-09 NOTE — Progress Notes (Signed)
ED report received at 0800 and Pt admitted to the unit at 0830; VSS; telemetry applied and verified with CCMD; NT called to second verify. Pt IV intact and transfusing. Foley inserted by NT Heather with self assisted. Traction applied by ortho tech. Pt and son at bedside oriented to the unit and room; fall/safety precaution/prevention education completed. Pt clean dry and intact with not pressure ulcer or opened wound noted except for bruising to right forehead, right knee and RUE. RUE remains in sling; neuro check intact to all extremities. Call light within reach and will closely monitor pt. Dionne BucyP. Amo Arta Stump RN

## 2017-07-10 ENCOUNTER — Inpatient Hospital Stay (HOSPITAL_COMMUNITY): Payer: Medicare Other

## 2017-07-10 LAB — BASIC METABOLIC PANEL
ANION GAP: 12 (ref 5–15)
BUN: 11 mg/dL (ref 6–20)
CHLORIDE: 102 mmol/L (ref 101–111)
CO2: 21 mmol/L — AB (ref 22–32)
Calcium: 8.2 mg/dL — ABNORMAL LOW (ref 8.9–10.3)
Creatinine, Ser: 0.39 mg/dL — ABNORMAL LOW (ref 0.44–1.00)
GFR calc Af Amer: >60 mL/min (ref >60)
GFR calc non Af Amer: >60 mL/min (ref >60)
GLUCOSE: 86 mg/dL (ref 65–99)
Potassium: 3.7 mmol/L (ref 3.5–5.1)
Sodium: 135 mmol/L (ref 135–145)

## 2017-07-10 LAB — CBC
HEMATOCRIT: 32.2 % — AB (ref 36.0–46.0)
HEMOGLOBIN: 10.4 g/dL — AB (ref 12.0–15.0)
MCH: 29.9 pg (ref 26.0–34.0)
MCHC: 32.3 g/dL (ref 30.0–36.0)
MCV: 92.5 fL (ref 78.0–100.0)
Platelets: 206 10*3/uL (ref 150–400)
RBC: 3.48 MIL/uL — AB (ref 3.87–5.11)
RDW: 14 % (ref 11.5–15.5)
WBC: 6.7 10*3/uL (ref 4.0–10.5)

## 2017-07-10 MED ORDER — METOPROLOL TARTRATE 25 MG PO TABS
25.0000 mg | ORAL_TABLET | Freq: Two times a day (BID) | ORAL | Status: DC
  Administered 2017-07-10 – 2017-07-19 (×19): 25 mg via ORAL
  Filled 2017-07-10 (×19): qty 1

## 2017-07-10 MED ORDER — HYDRALAZINE HCL 20 MG/ML IJ SOLN
10.0000 mg | Freq: Four times a day (QID) | INTRAMUSCULAR | Status: DC | PRN
  Administered 2017-07-10: 10 mg via INTRAVENOUS
  Filled 2017-07-10: qty 1

## 2017-07-10 MED ORDER — BOOST / RESOURCE BREEZE PO LIQD CUSTOM
1.0000 | Freq: Three times a day (TID) | ORAL | Status: DC
  Administered 2017-07-10 – 2017-07-19 (×21): 1 via ORAL

## 2017-07-10 NOTE — Progress Notes (Signed)
   Subjective:   Procedure(s) (LRB): RIGHT TROCHANTERIC NAIL (Right) Patient reports pain as moderate.    Objective: Vital signs in last 24 hours: Temp:  [98.1 F (36.7 C)-98.6 F (37 C)] 98.6 F (37 C) (12/18 0500) Pulse Rate:  [93-120] 120 (12/18 0500) Resp:  [16-18] 16 (12/18 0500) BP: (155-195)/(80-104) 195/104 (12/18 0500) SpO2:  [91 %-100 %] 100 % (12/18 0500)  Intake/Output from previous day: 12/17 0701 - 12/18 0700 In: 120 [P.O.:120] Out: 2100 [Urine:2100] Intake/Output this shift: No intake/output data recorded.  Recent Labs    07/09/17 0212 07/09/17 0216  HGB 13.1 13.9   Recent Labs    07/09/17 0212 07/09/17 0216  WBC 10.5  --   RBC 4.33  --   HCT 39.5 41.0  PLT 333  --    Recent Labs    07/09/17 0212 07/09/17 0216 07/10/17 0621  NA 137 140 135  K 3.1* 3.2* 3.7  CL 102 102 102  CO2 25  --  21*  BUN 17 19 11   CREATININE 0.53 0.50 0.39*  GLUCOSE 156* 156* 86  CALCIUM 8.7*  --  8.2*   Recent Labs    07/09/17 0212  INR 0.96    Compartment soft No results found.  Assessment/Plan:   Procedure(s) (LRB): RIGHT TROCHANTERIC NAIL (Right) Plan:    Right hip surgery Wednesday afternoon. CT reviewed of right proximal humerus fracture. Will proceed with non op Tx for prox humerus. Fx impacted, glenohumeral joint good position. Mild angulation.   Eldred MangesMark C Amamda Curbow 07/10/2017, 7:41 AM

## 2017-07-10 NOTE — Care Management Note (Signed)
Case Management Note  Patient Details  Name: Misty [name] MRN: [id number] Date of Birth: 12/05/1934  Subjective/Objective:   Pt admitted on 07/09/17 s/p ground level fall with acute RT SDH without midline shift, small amount of intraventricular blood, RT frontal scalp hematoma, RT humeral neck fx, and RT intertrochanteric RT femur fx.  PTA, pt resided at home with her son.                   Action/Plan: Ortho repairs planned for 07/11/17 afternoon.  PT/OT to follow post op.  Will follow for discharge planning as pt progresses.    Expected Discharge Date:                  Expected Discharge Plan:     In-House Referral:  Clinical Social Work  Discharge planning Services  CM Consult  Post Acute Care Choice:    Choice offered to:     DME Arranged:    DME Agency:     HH Arranged:    HH Agency:     Status of Service:  In process, will continue to follow  If discussed at Long Length of Stay Meetings, dates discussed:    Additional Comments:  Quintella BatonJulie W. Taunia Frasco, RN, BSN  Trauma/Neuro ICU Case Manager [phone]

## 2017-07-10 NOTE — Progress Notes (Signed)
Patient ID: Misty [name], female   DOB: 06-30-1935, 82 y.o.   MRN: [id number]  [facility] Surgery Progress Note     Subjective: CC- nausea Patient reports dizziness and nausea. States that it started last night and has not subsided. She denies headache or blurry vision. She does have pain in her right hip and right shoulder, but reports it is fairly well controlled. Likely going to OR tomorrow with ortho. Cannot remember when her last BM was, refused enema yesterday. Tolerated few bites of diet since admission.  Objective: Vital signs in last 24 hours: Temp:  [98.1 F (36.7 C)-98.6 F (37 C)] 98.6 F (37 C) (12/18 0500) Pulse Rate:  [93-120] 120 (12/18 0500) Resp:  [16-18] 16 (12/18 0500) BP: (155-195)/(80-104) 195/104 (12/18 0500) SpO2:  [91 %-100 %] 100 % (12/18 0500) Last BM Date: 07/09/17  Intake/Output from previous day: 12/17 0701 - 12/18 0700 In: 120 [P.O.:120] Out: 2100 [Urine:2100] Intake/Output this shift: No intake/output data recorded.  PE: Gen:  Alert, NAD, pleasant HEENT: EOM's intact, pupils equal and round. Ecchymosis anterior R lateral forehead Card:  RRR, no M/G/R heard Pulm:  CTAB, no W/R/R, effort normal Abd: Soft, NT/ND, +BS, no HSM, no hernia RLE: in traction. Foot WWP, 2+ DP pulse, able to wiggle toes RUE: in sling, fingers WWP, 2+ radial pulse, able to wiggle fingers Psych: A&Ox3  Skin: warm and dry  Lab Results:  Recent Labs    07/09/17 0212 07/09/17 0216  WBC 10.5  --   HGB 13.1 13.9  HCT 39.5 41.0  PLT 333  --    BMET Recent Labs    07/09/17 0212 07/09/17 0216 07/10/17 0621  NA 137 140 135  K 3.1* 3.2* 3.7  CL 102 102 102  CO2 25  --  21*  GLUCOSE 156* 156* 86  BUN 17 19 11   CREATININE 0.53 0.50 0.39*  CALCIUM 8.7*  --  8.2*   PT/INR Recent Labs    07/09/17 0212  LABPROT 12.6  INR 0.96   CMP     Component Value Date/Time   NA 135 07/10/2017 0621   K 3.7 07/10/2017 0621   CL 102 07/10/2017 0621   CO2 21 (L)  07/10/2017 0621   GLUCOSE 86 07/10/2017 0621   BUN 11 07/10/2017 0621   CREATININE 0.39 (L) 07/10/2017 0621   CALCIUM 8.2 (L) 07/10/2017 0621   PROT 6.4 (L) 07/09/2017 0212   ALBUMIN 3.5 07/09/2017 0212   AST 22 07/09/2017 0212   ALT 18 07/09/2017 0212   ALKPHOS 73 07/09/2017 0212   BILITOT 0.6 07/09/2017 0212   GFRNONAA >60 07/10/2017 0621   GFRAA >60 07/10/2017 0621   Lipase  No results found for: LIPASE     Studies/Results: Dg Chest 1 View  Result Date: 07/09/2017 CLINICAL DATA:  Initial evaluation for acute trauma, fall. EXAM: CHEST 1 VIEW COMPARISON:  Prior CT from earlier the same day. FINDINGS: Mild cardiomegaly. Mediastinal silhouette normal. Aortic atherosclerosis. Lungs normally inflated. No focal infiltrates. No pulmonary edema or appreciable pleural effusion. No pneumothorax. Acute fracture through the right humeral neck with impaction. No other acute osseous abnormality. IMPRESSION: 1. Acute fracture through the right humeral neck with impaction. 2. No other acute cardiopulmonary abnormality within the chest. 3. Mild cardiomegaly with aortic atherosclerosis. Electronically Signed   By: Rise Mu M.D.   On: 07/09/2017 04:35   Dg Shoulder Right  Result Date: 07/09/2017 CLINICAL DATA:  Initial evaluation for acute trauma, fall. EXAM: RIGHT SHOULDER -  2+ VIEW COMPARISON:  Prior CT from earlier same day. FINDINGS: There is an acute transverse fracture through the surgical neck of the right humerus with slight impaction. Humeral head is rotated and slightly displaced laterally. Glenoid intact. Clavicle intact. Mild soft tissue swelling. Visualize right hemithorax is clear. IMPRESSION: Acute transverse fracture through the right humeral neck with impaction. Electronically Signed   By: Rise Mu M.D.   On: 07/09/2017 04:37   Ct Head Wo Contrast  Result Date: 07/09/2017 CLINICAL DATA:  Unwitnessed fall at home. EXAM: CT HEAD WITHOUT CONTRAST CT CERVICAL  SPINE WITHOUT CONTRAST TECHNIQUE: Multidetector CT imaging of the head and cervical spine was performed following the standard protocol without intravenous contrast. Multiplanar CT image reconstructions of the cervical spine were also generated. COMPARISON:  None. FINDINGS: CT HEAD FINDINGS Brain: Trace dependent hyperdensity in the posterior oral left lateral ventricle suspicious for small amount of intraventricular hemorrhage. Similar hyperdensity in the right appears contiguous with the choroid plexus may be additional tiny site of hemorrhage or related to the choroid. Ventricular dilatation likely secondary to atrophy. Thin right subdural hematoma measures up to 2 mm in thickness. No midline shift. Moderate periventricular white matter changes consistent with chronic small vessel ischemia. No evidence of acute ischemia. Vascular: Atherosclerosis of skullbase vasculature without hyperdense vessel or abnormal calcification. Skull: No fracture or focal lesion. Sinuses/Orbits: Paranasal sinuses and mastoid air cells are clear. The visualized orbits are unremarkable. Bilateral cataract resection. Other: Right frontal scalp hematoma. CT CERVICAL SPINE FINDINGS Alignment: Exaggerated cervical lordosis.  No traumatic subluxation. Skull base and vertebrae: The dens and skull base are intact. Cervical vertebral body heights are preserved. Mild loss of height of T1 vertebral body, moderate to severe T2 vertebral body, and mild T3 vertebral body. Degenerative pannus at the atlanto dens interval. Bones are under mineralized. Soft tissues and spinal canal: No prevertebral fluid or swelling. No visible canal hematoma. Disc levels: Disc space narrowing at C6-C7. Mild multilevel endplate spurring. Upper chest: Assessed on concurrent chest CT. Calcified granuloma at the left lung apex. Other: Carotid calcifications. IMPRESSION: 1. Thin acute right subdural hematoma measuring 2 mm. No midline shift. Small amount of  intraventricular blood in the left and right lateral ventricle. 2. Right frontal scalp hematoma without skull fracture. 3. Generally cerebral atrophy. Moderate chronic small vessel ischemia. 4. No acute fracture or subluxation of the cervical spine. 5. Compression fractures of upper thoracic vertebra, characterized on concurrent chest CT. Critical Value/emergent results were called by telephone at the time of interpretation on 07/09/2017 at 4:02 am to Dr. Glynn Octave , who verbally acknowledged these results. Electronically Signed   By: Rubye Oaks M.D.   On: 07/09/2017 04:02   Ct Chest W Contrast  Result Date: 07/09/2017 CLINICAL DATA:  Initial evaluation for acute trauma, unwitnessed fall. EXAM: CT CHEST, ABDOMEN, AND PELVIS WITH CONTRAST TECHNIQUE: Multidetector CT imaging of the chest, abdomen and pelvis was performed following the standard protocol during bolus administration of intravenous contrast. CONTRAST:  50mL ISOVUE-300 IOPAMIDOL (ISOVUE-300) INJECTION 61% COMPARISON:  None. FINDINGS: CT CHEST FINDINGS Cardiovascular: Intrathoracic aorta of normal caliber without aneurysm or other acute abnormality. Extensive atheromatous plaque throughout the arch and descending intrathoracic aorta. Partially visualized great vessels are tortuous but within normal limits. Mild cardiomegaly noted. Diffuse 3 vessel coronary artery calcifications. No pericardial effusion. Limited evaluation of the pulmonary arterial tree grossly unremarkable. Mediastinum/Nodes: Thyroid normal. No pathologically enlarged mediastinal, hilar, or axillary lymph nodes. Esophagus within normal limits. Lungs/Pleura: Tracheobronchial tree intact  and patent. Small layering bilateral pleural effusions with associated atelectasis. Lungs are otherwise clear. No focal infiltrates. No pulmonary edema. No pneumothorax. No worrisome pulmonary nodule or mass. Musculoskeletal: There are compression deformities involving the T2, T3, T6, T7, T8,  and T9 vertebral bodies, favored to be chronic in nature. Exaggeration of the normal thoracic kyphosis. No acute rib fracture. There is an acute fracture through the neck of the right humerus with impaction. Adjacent soft tissue swelling. No discrete lytic or blastic osseous lesions. CT ABDOMEN PELVIS FINDINGS Hepatobiliary: Subcentimeter hypodensity within left hepatic lobe noted, too small the characterize, but of doubtful significance. Liver otherwise unremarkable. Gallbladder surgically absent. Mild prominence of common bile duct like related age and post cholecystectomy changes. Pancreas: Pancreas within normal limits. Spleen: Spleen within normal limits. Adrenals/Urinary Tract: Adrenal glands are normal. Kidneys equal in size with symmetric enhancement. Small right renal cyst noted. No nephrolithiasis or hydronephrosis. No focal enhancing renal mass. No appreciable hydroureter. Bladder moderately distended without acute abnormality. Stomach/Bowel: Stomach within normal limits. No evidence for bowel obstruction or acute bowel injury. Large volume stool impacted within the rectal vault, consistent with constipation. No acute inflammatory changes seen about the bowels. Vascular/Lymphatic: Extensive atherosclerosis throughout the intra-abdominal aorta and its branch vessels. No aneurysm. Mesenteric vessels are patent proximally. No adenopathy. Reproductive: Uterus appears to be absent. Ovaries not discretely identified. Other: No free air or fluid. Musculoskeletal: External soft tissues within normal limits. There is an acute fracture through the right femoral neck. Bony pelvis otherwise intact. Compression deformity seen involving the L1 through L5 vertebral bodies, favored to be chronic. Sequelae of prior vertebral augmentation present at L3. No discrete lytic or blastic osseous lesions. IMPRESSION: 1. Acute fractures involving the right humeral neck and intertrochanteric left femur. 2. No other acute traumatic  injury within the chest, abdomen, and pelvis. 3. Multiple compression deformities throughout the thoracolumbar spine as above, favored to be chronic in nature. Correlation physical exam recommended. 4. Large volume stool within the colon, consistent with constipation. 5. Small layering bilateral pleural effusions with associated atelectasis. 6. Extensive atherosclerosis. Electronically Signed   By: Rise Mu M.D.   On: 07/09/2017 04:15   Ct Cervical Spine Wo Contrast  Result Date: 07/09/2017 CLINICAL DATA:  Unwitnessed fall at home. EXAM: CT HEAD WITHOUT CONTRAST CT CERVICAL SPINE WITHOUT CONTRAST TECHNIQUE: Multidetector CT imaging of the head and cervical spine was performed following the standard protocol without intravenous contrast. Multiplanar CT image reconstructions of the cervical spine were also generated. COMPARISON:  None. FINDINGS: CT HEAD FINDINGS Brain: Trace dependent hyperdensity in the posterior oral left lateral ventricle suspicious for small amount of intraventricular hemorrhage. Similar hyperdensity in the right appears contiguous with the choroid plexus may be additional tiny site of hemorrhage or related to the choroid. Ventricular dilatation likely secondary to atrophy. Thin right subdural hematoma measures up to 2 mm in thickness. No midline shift. Moderate periventricular white matter changes consistent with chronic small vessel ischemia. No evidence of acute ischemia. Vascular: Atherosclerosis of skullbase vasculature without hyperdense vessel or abnormal calcification. Skull: No fracture or focal lesion. Sinuses/Orbits: Paranasal sinuses and mastoid air cells are clear. The visualized orbits are unremarkable. Bilateral cataract resection. Other: Right frontal scalp hematoma. CT CERVICAL SPINE FINDINGS Alignment: Exaggerated cervical lordosis.  No traumatic subluxation. Skull base and vertebrae: The dens and skull base are intact. Cervical vertebral body heights are  preserved. Mild loss of height of T1 vertebral body, moderate to severe T2 vertebral body, and mild T3  vertebral body. Degenerative pannus at the atlanto dens interval. Bones are under mineralized. Soft tissues and spinal canal: No prevertebral fluid or swelling. No visible canal hematoma. Disc levels: Disc space narrowing at C6-C7. Mild multilevel endplate spurring. Upper chest: Assessed on concurrent chest CT. Calcified granuloma at the left lung apex. Other: Carotid calcifications. IMPRESSION: 1. Thin acute right subdural hematoma measuring 2 mm. No midline shift. Small amount of intraventricular blood in the left and right lateral ventricle. 2. Right frontal scalp hematoma without skull fracture. 3. Generally cerebral atrophy. Moderate chronic small vessel ischemia. 4. No acute fracture or subluxation of the cervical spine. 5. Compression fractures of upper thoracic vertebra, characterized on concurrent chest CT. Critical Value/emergent results were called by telephone at the time of interpretation on 07/09/2017 at 4:02 am to Dr. Glynn OctaveSTEPHEN RANCOUR , who verbally acknowledged these results. Electronically Signed   By: Rubye OaksMelanie  Ehinger M.D.   On: 07/09/2017 04:02   Ct Abdomen Pelvis W Contrast  Result Date: 07/09/2017 CLINICAL DATA:  Initial evaluation for acute trauma, unwitnessed fall. EXAM: CT CHEST, ABDOMEN, AND PELVIS WITH CONTRAST TECHNIQUE: Multidetector CT imaging of the chest, abdomen and pelvis was performed following the standard protocol during bolus administration of intravenous contrast. CONTRAST:  50mL ISOVUE-300 IOPAMIDOL (ISOVUE-300) INJECTION 61% COMPARISON:  None. FINDINGS: CT CHEST FINDINGS Cardiovascular: Intrathoracic aorta of normal caliber without aneurysm or other acute abnormality. Extensive atheromatous plaque throughout the arch and descending intrathoracic aorta. Partially visualized great vessels are tortuous but within normal limits. Mild cardiomegaly noted. Diffuse 3 vessel  coronary artery calcifications. No pericardial effusion. Limited evaluation of the pulmonary arterial tree grossly unremarkable. Mediastinum/Nodes: Thyroid normal. No pathologically enlarged mediastinal, hilar, or axillary lymph nodes. Esophagus within normal limits. Lungs/Pleura: Tracheobronchial tree intact and patent. Small layering bilateral pleural effusions with associated atelectasis. Lungs are otherwise clear. No focal infiltrates. No pulmonary edema. No pneumothorax. No worrisome pulmonary nodule or mass. Musculoskeletal: There are compression deformities involving the T2, T3, T6, T7, T8, and T9 vertebral bodies, favored to be chronic in nature. Exaggeration of the normal thoracic kyphosis. No acute rib fracture. There is an acute fracture through the neck of the right humerus with impaction. Adjacent soft tissue swelling. No discrete lytic or blastic osseous lesions. CT ABDOMEN PELVIS FINDINGS Hepatobiliary: Subcentimeter hypodensity within left hepatic lobe noted, too small the characterize, but of doubtful significance. Liver otherwise unremarkable. Gallbladder surgically absent. Mild prominence of common bile duct like related age and post cholecystectomy changes. Pancreas: Pancreas within normal limits. Spleen: Spleen within normal limits. Adrenals/Urinary Tract: Adrenal glands are normal. Kidneys equal in size with symmetric enhancement. Small right renal cyst noted. No nephrolithiasis or hydronephrosis. No focal enhancing renal mass. No appreciable hydroureter. Bladder moderately distended without acute abnormality. Stomach/Bowel: Stomach within normal limits. No evidence for bowel obstruction or acute bowel injury. Large volume stool impacted within the rectal vault, consistent with constipation. No acute inflammatory changes seen about the bowels. Vascular/Lymphatic: Extensive atherosclerosis throughout the intra-abdominal aorta and its branch vessels. No aneurysm. Mesenteric vessels are patent  proximally. No adenopathy. Reproductive: Uterus appears to be absent. Ovaries not discretely identified. Other: No free air or fluid. Musculoskeletal: External soft tissues within normal limits. There is an acute fracture through the right femoral neck. Bony pelvis otherwise intact. Compression deformity seen involving the L1 through L5 vertebral bodies, favored to be chronic. Sequelae of prior vertebral augmentation present at L3. No discrete lytic or blastic osseous lesions. IMPRESSION: 1. Acute fractures involving the right humeral neck and  intertrochanteric left femur. 2. No other acute traumatic injury within the chest, abdomen, and pelvis. 3. Multiple compression deformities throughout the thoracolumbar spine as above, favored to be chronic in nature. Correlation physical exam recommended. 4. Large volume stool within the colon, consistent with constipation. 5. Small layering bilateral pleural effusions with associated atelectasis. 6. Extensive atherosclerosis. Electronically Signed   By: Rise MuBenjamin  McClintock M.D.   On: 07/09/2017 04:15   Dg Hip Unilat With Pelvis 2-3 Views Right  Result Date: 07/09/2017 CLINICAL DATA:  Initial evaluation for acute trauma, fall. EXAM: DG HIP (WITH OR WITHOUT PELVIS) 2-3V RIGHT COMPARISON:  Prior CT from earlier same day. FINDINGS: Acute nondisplaced intertrochanteric fracture of the right femur. Right femoral head remains normally aligned within the acetabulum. Femoral head height preserved. Bony pelvis intact. SI joints approximated. No acute osseus abnormality about the left hip. IV contrast material present within the bladder. Sequelae of prior vertebral augmentation present within the lower lumbar spine. IMPRESSION: Acute nondisplaced intertrochanteric fracture of the right femur. Electronically Signed   By: Rise MuBenjamin  McClintock M.D.   On: 07/09/2017 04:36    Anti-infectives: Anti-infectives (From admission, onward)   None       Assessment/Plan HTN -  continue home med norvasc 5mg , add metoprolol 25mg  BID and hydralazine PRN Osteoporosis Chronic pain - takes tramadol at home  Fall SDH/Concussion - per NS, stable and does not require repeat head CT unless change in neurological status. NS signed off R frontal scalp hematoma - ice prn R humeral neck fx - per ortho, CT pending. Continue sling, NWB R intertrochanteric femur fracture - per ortho, planning IMN tomorrow. Continue bucks traction, NWB Constipation - continue miralax/colace, enema PRN  ID - none FEN - IVF, heart healthy diet, Boost VTE - SCD  Plan - Add hydralazine and metoprolol as above. OR tomorrow with ortho. CIR following. Labs in AM.   LOS: 1 day    Franne FortsBrooke A Colletta Spillers , [facility] 07/10/2017, 7:38 AM Pager: [phone] Consults: [phone] Mon-Fri 7:00 am-4:30 pm Sat-Sun 7:00 am-11:30 am

## 2017-07-10 NOTE — Progress Notes (Signed)
PT Cancellation Note  Patient Details Name: Bernette RedbirdJoan Vitug MRN: [id number] DOB: 10/04/1934   Cancelled Treatment:    Reason Eval/Treat Not Completed: Patient not medically ready. Pt with active bedrest orders and plan for OR tomorrow 12/19. Will hold PT eval at this time and await updated activity orders/WB orders.   Marylynn PearsonLaura D Deziray Nabi 07/10/2017, 7:45 AM   Conni SlipperLaura Javan Gonzaga, PT, DPT Acute Rehabilitation Services Pager: [phone]

## 2017-07-10 NOTE — Progress Notes (Signed)
OT Cancellation Note  Patient Details Name: Bernette RedbirdJoan Sullenger MRN: [id number] DOB: 03/29/1935   Cancelled Treatment:    Reason Eval/Treat Not Completed: Patient not medically ready. Pt with bedrest orders and plan for OR tomorrow afternoon 07/11/17. Will hold OT evaluation at this time and evaluate post-operatively.   Doristine Sectionharity A Monifah Freehling, MS OTR/L  Pager: [phone]   Tabatha Razzano A Aryanah Enslow 07/10/2017, 9:00 AM

## 2017-07-11 ENCOUNTER — Encounter (HOSPITAL_COMMUNITY): Payer: Self-pay | Admitting: Certified Registered Nurse Anesthetist

## 2017-07-11 ENCOUNTER — Inpatient Hospital Stay (HOSPITAL_COMMUNITY): Payer: Medicare Other | Admitting: Certified Registered Nurse Anesthetist

## 2017-07-11 ENCOUNTER — Encounter (HOSPITAL_COMMUNITY): Admission: EM | Disposition: A | Discharge status: Skilled Nursing Facility | Payer: Self-pay | Source: Home / Self Care

## 2017-07-11 ENCOUNTER — Inpatient Hospital Stay (HOSPITAL_COMMUNITY): Payer: Medicare Other

## 2017-07-11 DIAGNOSIS — S42221A 2-part displaced fracture of surgical neck of right humerus, initial encounter for closed fracture: Secondary | ICD-10-CM

## 2017-07-11 DIAGNOSIS — S72141A Displaced intertrochanteric fracture of right femur, initial encounter for closed fracture: Secondary | ICD-10-CM

## 2017-07-11 HISTORY — PX: INTRAMEDULLARY (IM) NAIL INTERTROCHANTERIC: SHX5875

## 2017-07-11 LAB — BASIC METABOLIC PANEL
Anion gap: 9 (ref 5–15)
BUN: 14 mg/dL (ref 6–20)
CHLORIDE: 98 mmol/L — AB (ref 101–111)
CO2: 25 mmol/L (ref 22–32)
CREATININE: 0.43 mg/dL — AB (ref 0.44–1.00)
Calcium: 8.3 mg/dL — ABNORMAL LOW (ref 8.9–10.3)
GFR calc non Af Amer: >60 mL/min (ref >60)
Glucose, Bld: 98 mg/dL (ref 65–99)
Potassium: 3.7 mmol/L (ref 3.5–5.1)
Sodium: 132 mmol/L — ABNORMAL LOW (ref 135–145)

## 2017-07-11 LAB — CBC
HEMATOCRIT: 32.8 % — AB (ref 36.0–46.0)
HEMOGLOBIN: 10.6 g/dL — AB (ref 12.0–15.0)
MCH: 29.7 pg (ref 26.0–34.0)
MCHC: 32.3 g/dL (ref 30.0–36.0)
MCV: 91.9 fL (ref 78.0–100.0)
Platelets: 249 10*3/uL (ref 150–400)
RBC: 3.57 MIL/uL — ABNORMAL LOW (ref 3.87–5.11)
RDW: 14.1 % (ref 11.5–15.5)
WBC: 9.2 10*3/uL (ref 4.0–10.5)

## 2017-07-11 LAB — SURGICAL PCR SCREEN
MRSA, PCR: NEGATIVE
STAPHYLOCOCCUS AUREUS: POSITIVE — AB

## 2017-07-11 SURGERY — FIXATION, FRACTURE, INTERTROCHANTERIC, WITH INTRAMEDULLARY ROD
Anesthesia: General | Laterality: Right

## 2017-07-11 MED ORDER — FENTANYL CITRATE (PF) 100 MCG/2ML IJ SOLN
INTRAMUSCULAR | Status: AC
  Administered 2017-07-11: 25 ug via INTRAVENOUS
  Filled 2017-07-11: qty 2

## 2017-07-11 MED ORDER — DEXAMETHASONE SODIUM PHOSPHATE 10 MG/ML IJ SOLN
INTRAMUSCULAR | Status: DC | PRN
  Administered 2017-07-11: 10 mg via INTRAVENOUS

## 2017-07-11 MED ORDER — ROCURONIUM BROMIDE 50 MG/5ML IV SOSY
PREFILLED_SYRINGE | INTRAVENOUS | Status: DC | PRN
  Administered 2017-07-11: 40 mg via INTRAVENOUS

## 2017-07-11 MED ORDER — LIDOCAINE 2% (20 MG/ML) 5 ML SYRINGE
INTRAMUSCULAR | Status: AC
  Filled 2017-07-11: qty 5

## 2017-07-11 MED ORDER — CEFAZOLIN SODIUM-DEXTROSE 1-4 GM/50ML-% IV SOLN
1.0000 g | Freq: Three times a day (TID) | INTRAVENOUS | Status: AC
  Administered 2017-07-11 – 2017-07-12 (×2): 1 g via INTRAVENOUS
  Filled 2017-07-11 (×3): qty 50

## 2017-07-11 MED ORDER — PROPOFOL 10 MG/ML IV BOLUS
INTRAVENOUS | Status: AC
  Filled 2017-07-11: qty 20

## 2017-07-11 MED ORDER — PHENOL 1.4 % MT LIQD: 1.0000 | OROMUCOSAL | Status: DC | PRN

## 2017-07-11 MED ORDER — CHLORHEXIDINE GLUCONATE CLOTH 2 % EX PADS
6.0000 | MEDICATED_PAD | Freq: Every day | CUTANEOUS | Status: DC
  Administered 2017-07-11 – 2017-07-15 (×4): 6 via TOPICAL

## 2017-07-11 MED ORDER — ACETAMINOPHEN 325 MG PO TABS: 650.0000 mg | ORAL_TABLET | Freq: Four times a day (QID) | ORAL | Status: DC | PRN

## 2017-07-11 MED ORDER — LIDOCAINE 2% (20 MG/ML) 5 ML SYRINGE
INTRAMUSCULAR | Status: DC | PRN
  Administered 2017-07-11: 40 mg via INTRAVENOUS

## 2017-07-11 MED ORDER — ONDANSETRON HCL 4 MG/2ML IJ SOLN
INTRAMUSCULAR | Status: AC
  Filled 2017-07-11: qty 2

## 2017-07-11 MED ORDER — ACETAMINOPHEN 650 MG RE SUPP: 650.0000 mg | Freq: Four times a day (QID) | RECTAL | Status: DC | PRN

## 2017-07-11 MED ORDER — CEFAZOLIN SODIUM 1 G IJ SOLR
INTRAMUSCULAR | Status: AC
  Filled 2017-07-11: qty 20

## 2017-07-11 MED ORDER — BUPIVACAINE-EPINEPHRINE (PF) 0.5% -1:200000 IJ SOLN
INTRAMUSCULAR | Status: AC
  Filled 2017-07-11: qty 30

## 2017-07-11 MED ORDER — ONDANSETRON HCL 4 MG/2ML IJ SOLN
INTRAMUSCULAR | Status: DC | PRN
  Administered 2017-07-11: 4 mg via INTRAVENOUS

## 2017-07-11 MED ORDER — MUPIROCIN 2 % EX OINT
1.0000 "application " | TOPICAL_OINTMENT | Freq: Two times a day (BID) | CUTANEOUS | Status: AC
  Administered 2017-07-11 – 2017-07-15 (×10): 1 via NASAL
  Filled 2017-07-11 (×2): qty 22

## 2017-07-11 MED ORDER — ROCURONIUM BROMIDE 10 MG/ML (PF) SYRINGE
PREFILLED_SYRINGE | INTRAVENOUS | Status: AC
  Filled 2017-07-11: qty 5

## 2017-07-11 MED ORDER — DEXAMETHASONE SODIUM PHOSPHATE 10 MG/ML IJ SOLN
INTRAMUSCULAR | Status: AC
  Filled 2017-07-11: qty 1

## 2017-07-11 MED ORDER — MENTHOL 3 MG MT LOZG: 1.0000 | LOZENGE | OROMUCOSAL | Status: DC | PRN

## 2017-07-11 MED ORDER — ONDANSETRON HCL 4 MG/2ML IJ SOLN
INTRAMUSCULAR | Status: AC
  Administered 2017-07-11: 4 mg via INTRAVENOUS
  Filled 2017-07-11: qty 2

## 2017-07-11 MED ORDER — FENTANYL CITRATE (PF) 100 MCG/2ML IJ SOLN
25.0000 ug | INTRAMUSCULAR | Status: DC | PRN
  Administered 2017-07-11 (×2): 25 ug via INTRAVENOUS

## 2017-07-11 MED ORDER — LACTATED RINGERS IV SOLN
INTRAVENOUS | Status: DC
  Administered 2017-07-11: 14:00:00 via INTRAVENOUS

## 2017-07-11 MED ORDER — SUGAMMADEX SODIUM 200 MG/2ML IV SOLN
INTRAVENOUS | Status: DC | PRN
  Administered 2017-07-11: 200 mg via INTRAVENOUS

## 2017-07-11 MED ORDER — FENTANYL CITRATE (PF) 250 MCG/5ML IJ SOLN
INTRAMUSCULAR | Status: AC
  Filled 2017-07-11: qty 5

## 2017-07-11 MED ORDER — PROPOFOL 10 MG/ML IV BOLUS
INTRAVENOUS | Status: DC | PRN
  Administered 2017-07-11: 120 mg via INTRAVENOUS

## 2017-07-11 MED ORDER — FENTANYL CITRATE (PF) 100 MCG/2ML IJ SOLN
INTRAMUSCULAR | Status: DC | PRN
  Administered 2017-07-11: 50 ug via INTRAVENOUS
  Administered 2017-07-11: 25 ug via INTRAVENOUS
  Administered 2017-07-11: 50 ug via INTRAVENOUS
  Administered 2017-07-11: 25 ug via INTRAVENOUS

## 2017-07-11 MED ORDER — CEFAZOLIN SODIUM-DEXTROSE 2-3 GM-%(50ML) IV SOLR
INTRAVENOUS | Status: DC | PRN
  Administered 2017-07-11: 2 g via INTRAVENOUS

## 2017-07-11 MED ORDER — ONDANSETRON HCL 4 MG PO TABS
4.0000 mg | ORAL_TABLET | Freq: Four times a day (QID) | ORAL | Status: DC | PRN
  Administered 2017-07-15: 4 mg via ORAL
  Filled 2017-07-11: qty 1

## 2017-07-11 MED ORDER — SUGAMMADEX SODIUM 200 MG/2ML IV SOLN
INTRAVENOUS | Status: AC
  Filled 2017-07-11: qty 2

## 2017-07-11 MED ORDER — ONDANSETRON HCL 4 MG/2ML IJ SOLN
4.0000 mg | Freq: Four times a day (QID) | INTRAMUSCULAR | Status: DC | PRN
  Administered 2017-07-15 – 2017-07-19 (×6): 4 mg via INTRAVENOUS
  Filled 2017-07-11: qty 2

## 2017-07-11 MED ORDER — ONDANSETRON HCL 4 MG/2ML IJ SOLN
4.0000 mg | Freq: Once | INTRAMUSCULAR | Status: AC | PRN
  Administered 2017-07-11: 4 mg via INTRAVENOUS

## 2017-07-11 SURGICAL SUPPLY — 41 items
BIT DRILL CANN LG 4.3MM (BIT) ×1 IMPLANT
BNDG COHESIVE 4X5 TAN STRL (GAUZE/BANDAGES/DRESSINGS) ×3 IMPLANT
CANISTER SUCT 3000ML PPV (MISCELLANEOUS) IMPLANT
COVER MAYO STAND STRL (DRAPES) ×3 IMPLANT
COVER PERINEAL POST (MISCELLANEOUS) ×3 IMPLANT
COVER SURGICAL LIGHT HANDLE (MISCELLANEOUS) ×3 IMPLANT
DRAPE C-ARM 42X72 X-RAY (DRAPES) ×3 IMPLANT
DRAPE STERI IOBAN 125X83 (DRAPES) ×3 IMPLANT
DRILL BIT CANN LG 4.3MM (BIT) ×3
DRSG MEPILEX BORDER 4X4 (GAUZE/BANDAGES/DRESSINGS) ×3 IMPLANT
DRSG MEPILEX BORDER 4X8 (GAUZE/BANDAGES/DRESSINGS) ×3 IMPLANT
DRSG PAD ABDOMINAL 8X10 ST (GAUZE/BANDAGES/DRESSINGS) IMPLANT
DURAPREP 26ML APPLICATOR (WOUND CARE) ×3 IMPLANT
ELECT REM PT RETURN 9FT ADLT (ELECTROSURGICAL) ×3
ELECTRODE REM PT RTRN 9FT ADLT (ELECTROSURGICAL) ×1 IMPLANT
EVACUATOR 1/8 PVC DRAIN (DRAIN) IMPLANT
GAUZE SPONGE 4X4 12PLY STRL (GAUZE/BANDAGES/DRESSINGS) ×3 IMPLANT
GAUZE XEROFORM 1X8 LF (GAUZE/BANDAGES/DRESSINGS) IMPLANT
GLOVE BIOGEL PI IND STRL 8 (GLOVE) ×2 IMPLANT
GLOVE BIOGEL PI INDICATOR 8 (GLOVE) ×4
GLOVE ORTHO TXT STRL SZ7.5 (GLOVE) ×6 IMPLANT
GOWN STRL REUS W/ TWL LRG LVL3 (GOWN DISPOSABLE) ×1 IMPLANT
GOWN STRL REUS W/TWL 2XL LVL3 (GOWN DISPOSABLE) ×3 IMPLANT
GOWN STRL REUS W/TWL LRG LVL3 (GOWN DISPOSABLE) ×5 IMPLANT
GUIDEPIN 3.2X17.5 THRD DISP (PIN) ×3 IMPLANT
KIT BASIN OR (CUSTOM PROCEDURE TRAY) ×3 IMPLANT
KIT ROOM TURNOVER OR (KITS) ×3 IMPLANT
LINER BOOT UNIVERSAL DISP (MISCELLANEOUS) ×3 IMPLANT
MANIFOLD NEPTUNE II (INSTRUMENTS) ×3 IMPLANT
NAIL HIP FRACT 130D 11X180 (Screw) ×3 IMPLANT
NS IRRIG 1000ML POUR BTL (IV SOLUTION) ×3 IMPLANT
PACK GENERAL/GYN (CUSTOM PROCEDURE TRAY) ×3 IMPLANT
PAD ARMBOARD 7.5X6 YLW CONV (MISCELLANEOUS) ×6 IMPLANT
SCREW BONE CORTICAL 5.0X3 (Screw) ×3 IMPLANT
SCREW LAG HIP NAIL 10.5X95 (Screw) ×3 IMPLANT
STAPLER VISISTAT 35W (STAPLE) ×3 IMPLANT
SUT VIC AB 0 CT1 27 (SUTURE) ×2
SUT VIC AB 0 CT1 27XBRD ANBCTR (SUTURE) ×1 IMPLANT
SUT VIC AB 2-0 CT1 27 (SUTURE) ×4
SUT VIC AB 2-0 CT1 TAPERPNT 27 (SUTURE) ×2 IMPLANT
WATER STERILE IRR 1000ML POUR (IV SOLUTION) IMPLANT

## 2017-07-11 NOTE — Anesthesia Preprocedure Evaluation (Signed)
Anesthesia Evaluation  Patient identified by MRN, date of birth, ID band Patient awake    Reviewed: Allergy & Precautions, NPO status , Patient's Chart, lab work & pertinent test results  Airway Mallampati: II  TM Distance: >3 FB Neck ROM: Full    Dental  (+) Teeth Intact, Dental Advisory Given   Pulmonary    breath sounds clear to auscultation       Cardiovascular hypertension,  Rhythm:Regular Rate:Normal     Neuro/Psych    GI/Hepatic   Endo/Other    Renal/GU      Musculoskeletal   Abdominal   Peds  Hematology   Anesthesia Other Findings   Reproductive/Obstetrics                             Anesthesia Physical Anesthesia Plan  ASA: III  Anesthesia Plan: General   Post-op Pain Management:    Induction: Intravenous  PONV Risk Score and Plan: 1 and Ondansetron and Dexamethasone  Airway Management Planned: Oral ETT  Additional Equipment:   Intra-op Plan:   Post-operative Plan: Extubation in OR  Informed Consent: I have reviewed the patients History and Physical, chart, labs and discussed the procedure including the risks, benefits and alternatives for the proposed anesthesia with the patient or authorized representative who has indicated his/her understanding and acceptance.   Dental advisory given  Plan Discussed with: CRNA and Anesthesiologist  Anesthesia Plan Comments:         Anesthesia Quick Evaluation  

## 2017-07-11 NOTE — Anesthesia Postprocedure Evaluation (Signed)
Anesthesia Post Note  Patient: Misty [name]  Procedure(s) Performed: RIGHT TROCHANTERIC NAIL (Right )     Patient location during evaluation: PACU Anesthesia Type: General Level of consciousness: awake, awake and alert and oriented Pain management: pain level controlled Vital Signs Assessment: post-procedure vital signs reviewed and stable Respiratory status: spontaneous breathing, nonlabored ventilation and respiratory function stable Cardiovascular status: blood pressure returned to baseline Anesthetic complications: no    Last Vitals:  Vitals:   07/11/17 1800 07/11/17 2033  BP: 123/72 109/85  Pulse: 95 98  Resp: 19 18  Temp:  36.7 C  SpO2: 95% 93%    Last Pain:  Vitals:   07/11/17 2033  TempSrc: Oral  PainSc:                  Atonya Templer COKER

## 2017-07-11 NOTE — Progress Notes (Signed)
Patient returned to unit from PACU. Alert and oriented x4. Denies pain at this time. Complains of nausea, was given Zofran in PACU. Patient states that it is improving. IV to right FA patent and intact. Mepilex dressings x2 C/D/I. No s/s of distress. VSS. Instructed to use call light for assist. Call light in reach. Family at bedside.

## 2017-07-11 NOTE — Op Note (Signed)
Preop diagnosis: right intertrochanteric hip fracture  Postop diagnosis same  Procedure: Right trochanteric nail fixation of right intertrochanteric hip fracture.  Surgeon: Annell GreeningMark Yates MD  Assistant: Zonia KiefJames Owens PA-C medically necessary and present for the entire procedure.  Anesthesia: General plus Marcaine local  Implants: Affixis 11 mm short  130 degree with 95mm lag screw and distal interlock 34 mm.  EBL: See anesthetic record  Procedure: After standard prepping and draping with the patient intubated and on the Hana table with well leg holder traction was applied internal rotation and C-arm was used that showed the fracture was now in anatomic position on AP and lateral x-ray.  DuraPrep was used preoperative Ancef timeout procedure area squared with towels large shower curtain Steri-Drape applied.  Incision was made proximal trochanter gluteus medius was split tip of the trochanter was palpated K wire drill was placed over drilled with the reamer under C arm visualization and then placement of the 11 mm Affixis short nail.  It was advanced so that the holes were in line with the inferior aspect and low portion of the neck.  Pin was drilled initially was slightly anterior was adjusted center in the head on lateral and low in the neck on AP.  Measured reamed screw placed distal interlock applied 34 mm.  It was locked statically.  Hip was rotated under fluoroscopy internal/external rotation to make sure there is no penetration through the head and screws in good position.  Irrigation with saline solution and then standard layer closure #1 Vicryl in the deep fascia to on subtenons tissue Marcaine infiltration of the skin and subcutaneous closure at the distal interlock and compression screw stab entry site.

## 2017-07-11 NOTE — Transfer of Care (Signed)
Immediate Anesthesia Transfer of Care Note  Patient: Misty [name]  Procedure(s) Performed: RIGHT TROCHANTERIC NAIL (Right )  Patient Location: PACU  Anesthesia Type:General  Level of Consciousness: awake, alert  and oriented  Airway & Oxygen Therapy: Patient Spontanous Breathing and Patient connected to face mask oxygen  Post-op Assessment: Report given to RN and Post -op Vital signs reviewed and stable  Post vital signs: Reviewed and stable  Last Vitals:  Vitals:   07/10/17 2106 07/11/17 0510  BP: (!) 155/74 134/65  Pulse: (!) 103 79  Resp:  17  Temp: 37.7 C 36.7 C  SpO2: 97% 96%    Last Pain:  Vitals:   07/11/17 1137  TempSrc:   PainSc: Asleep      Patients Stated Pain Goal: 0 (07/09/17 1439)  Complications: No apparent anesthesia complications

## 2017-07-11 NOTE — Care Management Important Message (Signed)
Important Message  Patient Details  Name: Bernette RedbirdJoan Alto MRN: [id number] Date of Birth: 09/29/1934   Medicare Important Message Given:  Yes    Jakel Alphin 07/11/2017, 3:31 PM

## 2017-07-11 NOTE — Interval H&P Note (Signed)
History and Physical Interval Note:  07/11/2017 2:32 PM  Misty [name]  has presented today for surgery, with the diagnosis of Right Intertrochanteric Hip Fracture  The various methods of treatment have been discussed with the patient and family. After consideration of risks, benefits and other options for treatment, the patient has consented to  Procedure(s): RIGHT TROCHANTERIC NAIL (Right) as a surgical intervention .  The patient's history has been reviewed, patient examined, no change in status, stable for surgery.  I have reviewed the patient's chart and labs.  Questions were answered to the patient's satisfaction.     Eldred MangesMark C Yates

## 2017-07-11 NOTE — Anesthesia Procedure Notes (Signed)
Procedure Name: Intubation Date/Time: 07/11/2017 4:01 PM Performed by: Pearson Grippeobertson, Layken Doenges M, CRNA Pre-anesthesia Checklist: Patient identified, Emergency Drugs available, Suction available and Patient being monitored Patient Re-evaluated:Patient Re-evaluated prior to induction Oxygen Delivery Method: Circle system utilized Preoxygenation: Pre-oxygenation with 100% oxygen Induction Type: IV induction Ventilation: Mask ventilation without difficulty Laryngoscope Size: Miller and 2 Grade View: Grade I Tube type: Oral Tube size: 7.0 mm Number of attempts: 1 Airway Equipment and Method: Stylet and Oral airway Placement Confirmation: ETT inserted through vocal cords under direct vision,  positive ETCO2 and breath sounds checked- equal and bilateral Secured at: 20 cm Tube secured with: Tape Dental Injury: Teeth and Oropharynx as per pre-operative assessment

## 2017-07-11 NOTE — Progress Notes (Signed)
Subjective: NPO for surgery, just spoke with one of her 5 daughters on the phone, pain controlled  Objective: Vital signs in last 24 hours: Temp:  [98.1 F (36.7 C)-99.8 F (37.7 C)] 98.1 F (36.7 C) (12/19 0510) Pulse Rate:  [79-103] 79 (12/19 0510) Resp:  [17] 17 (12/19 0510) BP: (134-178)/(65-74) 134/65 (12/19 0510) SpO2:  [96 %-97 %] 96 % (12/19 0510) Last BM Date: 07/09/17  Intake/Output from previous day: 12/18 0701 - 12/19 0700 In: 3481.3 [P.O.:300; I.V.:3181.3] Out: -  Intake/Output this shift: No intake/output data recorded.  General appearance: alert and cooperative Resp: clear to auscultation bilaterally Cardio: regular rate and rhythm GI: soft, NT Extremities: traction RLE  Forehead hematoma with ecchymosis  Lab Results: CBC  Recent Labs    07/10/17 0621 07/11/17 0611  WBC 6.7 9.2  HGB 10.4* 10.6*  HCT 32.2* 32.8*  PLT 206 249   BMET Recent Labs    07/10/17 0621 07/11/17 0611  NA 135 132*  K 3.7 3.7  CL 102 98*  CO2 21* 25  GLUCOSE 86 98  BUN 11 14  CREATININE 0.39* 0.43*  CALCIUM 8.2* 8.3*   PT/INR Recent Labs    07/09/17 0212  LABPROT 12.6  INR 0.96   ABG No results for input(s): PHART, HCO3 in the last 72 hours.  Invalid input(s): PCO2, PO2  Studies/Results: Ct Shoulder Right Wo Contrast  Result Date: 07/10/2017 CLINICAL DATA:  The patient suffered a proximal right humerus fracture due to a fall last night. Initial encounter. EXAM: CT OF THE UPPER RIGHT EXTREMITY WITHOUT CONTRAST TECHNIQUE: Multidetector CT imaging of the upper right extremity was performed according to the standard protocol. COMPARISON:  Plain films right shoulder 07/09/2017. FINDINGS: Bones/Joint/Cartilage There is an acute transverse fracture through the surgical neck of the right humerus. The humeral head is rotated posteriorly with the fracture line abutting the cortex of the proximal metaphysis of the humerus. There is 1 shaft width anterior displacement  of the distal fragment and foreshortening of 1.5-2 cm. The fracture is minimally comminuted. Nondisplaced component of the fracture extends into the greater tuberosity. The lesser tuberosity is spared. The acromioclavicular joint is intact with minimal degenerative change noted. Type 2 acromion is seen. No other fracture is identified. Ligaments Suboptimally assessed with CT. Muscles and Tendons The rotator cuff appears intact. No muscular atrophy or focal lesion. Soft tissues Soft tissue contusion is present about the patient's fracture. Imaged lung parenchyma is clear. IMPRESSION: Impacted and anteriorly displaced surgical neck fracture of the right humerus includes a nondisplaced component through the greater tuberosity. Electronically Signed   By: Drusilla Kannerhomas  Dalessio M.D.   On: 07/10/2017 09:48    Anti-infectives: Anti-infectives (From admission, onward)   None      Assessment/Plan: HTN - continue home med norvasc 5mg , metoprolol 25mg  BID and hydralazine PRN - HR and BP better on metoprolol Osteoporosis Chronic pain - takes tramadol at home  Fall SDH/Concussion - per NS, stable and does not require repeat head CT unless change in neurological status. NS signed off R frontal scalp hematoma - ice prn R humeral neck fx - non-op management per Dr. Ophelia CharterYates, sling R intertrochanteric femur fracture - ORIF today by Dr. Ophelia CharterYates Constipation - continue miralax/colace, enema PRN ABL anemia - has stabilized, CBC in AM  ID - none FEN - IVF, NPO for OR VTE - SCD  Plan -  LOS: 2 days    Violeta GelinasBurke Andrick Rust, MD, MPH, FACS Trauma: [phone] General Surgery: [phone]  12/19/2018Patient ID:  Misty RedbirdJoan Gellatly, female   DOB: 04/14/1935, 82 y.o.   MRN: [id number]

## 2017-07-12 ENCOUNTER — Encounter (HOSPITAL_COMMUNITY): Payer: Self-pay | Admitting: Orthopaedic Surgery

## 2017-07-12 ENCOUNTER — Other Ambulatory Visit: Payer: Self-pay

## 2017-07-12 LAB — BASIC METABOLIC PANEL
Anion gap: 9 (ref 5–15)
BUN: 14 mg/dL (ref 6–20)
CHLORIDE: 99 mmol/L — AB (ref 101–111)
CO2: 25 mmol/L (ref 22–32)
Calcium: 8.1 mg/dL — ABNORMAL LOW (ref 8.9–10.3)
Creatinine, Ser: 0.45 mg/dL (ref 0.44–1.00)
GFR calc Af Amer: >60 mL/min (ref >60)
GLUCOSE: 121 mg/dL — AB (ref 65–99)
POTASSIUM: 4 mmol/L (ref 3.5–5.1)
Sodium: 133 mmol/L — ABNORMAL LOW (ref 135–145)

## 2017-07-12 LAB — CBC
HEMATOCRIT: 30.5 % — AB (ref 36.0–46.0)
Hemoglobin: 10.1 g/dL — ABNORMAL LOW (ref 12.0–15.0)
MCH: 30.4 pg (ref 26.0–34.0)
MCHC: 33.1 g/dL (ref 30.0–36.0)
MCV: 91.9 fL (ref 78.0–100.0)
Platelets: 286 10*3/uL (ref 150–400)
RBC: 3.32 MIL/uL — ABNORMAL LOW (ref 3.87–5.11)
RDW: 14.2 % (ref 11.5–15.5)
WBC: 10.9 10*3/uL — AB (ref 4.0–10.5)

## 2017-07-12 MED ORDER — FLEET ENEMA 7-19 GM/118ML RE ENEM
1.0000 | ENEMA | Freq: Once | RECTAL | Status: DC
  Filled 2017-07-12: qty 1

## 2017-07-12 MED ORDER — OXYCODONE HCL 5 MG PO TABS
5.0000 mg | ORAL_TABLET | Freq: Four times a day (QID) | ORAL | Status: DC | PRN
  Administered 2017-07-12 – 2017-07-13 (×2): 5 mg via ORAL
  Filled 2017-07-12 (×2): qty 1

## 2017-07-12 MED ORDER — ENOXAPARIN SODIUM 30 MG/0.3ML ~~LOC~~ SOLN
30.0000 mg | SUBCUTANEOUS | Status: DC
  Administered 2017-07-13: 30 mg via SUBCUTANEOUS
  Filled 2017-07-12: qty 0.3

## 2017-07-12 MED ORDER — HYDROMORPHONE HCL 1 MG/ML IJ SOLN
0.3000 mg | Freq: Four times a day (QID) | INTRAMUSCULAR | Status: DC | PRN
  Administered 2017-07-13 – 2017-07-18 (×10): 0.3 mg via INTRAVENOUS
  Filled 2017-07-12 (×10): qty 0.5
  Filled 2017-07-12: qty 1

## 2017-07-12 MED ORDER — METHOCARBAMOL 500 MG PO TABS
500.0000 mg | ORAL_TABLET | Freq: Three times a day (TID) | ORAL | Status: DC | PRN
Start: 2017-07-12 — End: 2017-07-13
  Administered 2017-07-12 – 2017-07-13 (×2): 500 mg via ORAL
  Filled 2017-07-12 (×2): qty 1

## 2017-07-12 MED ORDER — ACETAMINOPHEN 500 MG PO TABS
1000.0000 mg | ORAL_TABLET | Freq: Three times a day (TID) | ORAL | Status: DC
  Administered 2017-07-12 – 2017-07-19 (×23): 1000 mg via ORAL
  Filled 2017-07-12 (×23): qty 2

## 2017-07-12 MED ORDER — ENOXAPARIN SODIUM 40 MG/0.4ML ~~LOC~~ SOLN
40.0000 mg | SUBCUTANEOUS | Status: DC
  Administered 2017-07-12: 40 mg via SUBCUTANEOUS
  Filled 2017-07-12: qty 0.4

## 2017-07-12 NOTE — Clinical Social Work Note (Signed)
Clinical Social Work Assessment  Patient Details  Name: Misty [name] MRN: [id number] Date of Birth: Sep 18, 1934  Date of referral:                  Reason for consult:  Facility Placement                Permission sought to share information with:  Chartered certified accountant granted to share information::  Yes, Verbal Permission Granted  Name::     Misty [name]  Agency::  SNF  Relationship::  son  Contact Information:     Housing/Transportation Living arrangements for the past 2 months:  Single Family Home Source of Information:  Patient, Adult Children Patient Interpreter Needed:  None Criminal Activity/Legal Involvement Pertinent to Current Situation/Hospitalization:  No - Comment as needed Significant Relationships:  Adult Children, Other Family Members Lives with:  Self, Adult Children Do you feel safe going back to the place where you live?  No Need for family participation in patient care:  Yes (Comment)  Care giving concerns:  Pt from home with son and family. Pt adopted two of her great grand children and provides care to them. Per son, pt from [place] and moved to [place] about a year or so ago. Until up until a year ago she was ambulating independently, then she had a fall and uses a can for ambulation and refused a walker at home. Son indicates that after she completes short term rehab, he knows his mom will need a cane and wheelchair. He is already making preparations at home for her transition back after skilled nursing. In light of everything, she will need short term rehab until her impairment improves.  Pt not safe to return home at this time.  Social Worker assessment / plan:  CSW discussed the SNF placement and recommendations with son. When CSW met with patient at bedside she was in pain and referred CSW to call son. CSW discussed discharge process. Son prefers a SNF close to their home in [place]. CSW sent to all local SNF's including [place] as  well.   CSW will f/u on disposition.  Employment status:  Retired Forensic scientist:  Medicare PT Recommendations:  [place] / Referral to community resources:  [place]  Patient/Family's Response to care:  Family appreciative of CSW assistance with SNF placement and options. No issues or concerns identified at this time.  Patient/Family's Understanding of and Emotional Response to Diagnosis, Current Treatment, and Prognosis:  Patient/family verbalized agreement with the SNF recommendations and understands that patient will need skilled nursing care given new impairment. Family is hoping that patient will improve with skilled nursing and get back home. Family is preparing home for patient to use walker and wheelchair at home to get around at home and in community. No other issues or concerns identified.  Emotional Assessment Appearance:  Appears stated age Attitude/Demeanor/Rapport:  (Cooperative) Affect (typically observed):  Accepting, Appropriate Orientation:  Oriented to Situation, Oriented to Self Alcohol / Substance use:  Not Applicable Psych involvement (Current and /or in the community):  No (Comment)  Discharge Needs  Concerns to be addressed:  Discharge Planning Concerns Readmission within the last 30 days:  No Current discharge risk:  Dependent with Mobility, Physical Impairment Barriers to Discharge:  No Barriers Identified   Normajean Baxter, LCSW 07/12/2017, 10:06 AM

## 2017-07-12 NOTE — Progress Notes (Addendum)
   Subjective: 1 Day Post-Op Procedure(s) (LRB): RIGHT TROCHANTERIC NAIL (Right) Patient reports pain as mild in hip, moderate right shoulder.    Objective: Vital signs in last 24 hours: Temp:  [97.1 F (36.2 C)-98.1 F (36.7 C)] 97.5 F (36.4 C) (12/20 0434) Pulse Rate:  [77-98] 82 (12/20 0434) Resp:  [12-19] 16 (12/20 0434) BP: (109-138)/(62-85) 128/66 (12/20 0434) SpO2:  [93 %-100 %] 96 % (12/20 0434) Weight:  [93 lb (42.2 kg)] 93 lb (42.2 kg) (12/19 1409)  Intake/Output from previous day: 12/19 0701 - 12/20 0700 In: 2389.7 [P.O.:360; I.V.:1979.7; IV Piggyback:50] Out: 1050 [Urine:1000; Blood:50] Intake/Output this shift: No intake/output data recorded.  Recent Labs    07/10/17 0621 07/11/17 0611  HGB 10.4* 10.6*   Recent Labs    07/10/17 0621 07/11/17 0611  WBC 6.7 9.2  RBC 3.48* 3.57*  HCT 32.2* 32.8*  PLT 206 249   Recent Labs    07/10/17 0621 07/11/17 0611  NA 135 132*  K 3.7 3.7  CL 102 98*  CO2 21* 25  BUN 11 14  CREATININE 0.39* 0.43*  GLUCOSE 86 98  CALCIUM 8.2* 8.3*   No results for input(s): LABPT, INR in the last 72 hours.  Neurologically intact Dg C-arm 1-60 Min  Result Date: 07/11/2017 CLINICAL DATA:  Femur fracture fixation EXAM: OPERATIVE RIGHT HIP (WITH PELVIS IF PERFORMED) 2 VIEWS TECHNIQUE: Fluoroscopic spot image(s) were submitted for interpretation post-operatively. COMPARISON:  Hip radiography from 2 days ago FINDINGS: Right femoral nail fixation with dynamic hip screw. Anatomic fracture alignment. No evidence of intraoperative fracture. IMPRESSION: Fluoroscopy for intertrochanteric femur fracture fixation on the right. Electronically Signed   By: Marnee SpringJonathon  Watts M.D.   On: 07/11/2017 16:58   Dg Hip Operative Unilat With Pelvis Right  Result Date: 07/11/2017 CLINICAL DATA:  Femur fracture fixation EXAM: OPERATIVE RIGHT HIP (WITH PELVIS IF PERFORMED) 2 VIEWS TECHNIQUE: Fluoroscopic spot image(s) were submitted for interpretation  post-operatively. COMPARISON:  Hip radiography from 2 days ago FINDINGS: Right femoral nail fixation with dynamic hip screw. Anatomic fracture alignment. No evidence of intraoperative fracture. IMPRESSION: Fluoroscopy for intertrochanteric femur fracture fixation on the right. Electronically Signed   By: Marnee SpringJonathon  Watts M.D.   On: 07/11/2017 16:58    Assessment/Plan: 1 Day Post-Op Procedure(s) (LRB): RIGHT TROCHANTERIC NAIL (Right) Up with therapy , OOB to recliner WBAT right LE for transfers.   Eldred MangesMark C Shray Hunley 07/12/2017, 7:31 AM

## 2017-07-12 NOTE — NC FL2 (Signed)
[place] MEDICAID FL2 LEVEL OF CARE SCREENING TOOL     IDENTIFICATION  Patient Name: Misty [name] Birthdate: 03/02/1935 Sex: female Admission Date (Current Location): 07/09/2017  [facility] [place]   [facility] and Address[facility]  [facility] [address], [place], [place] [address]      Provider Number: [id number]  Attending Physician Name and Address:  Md, Trauma, MD  Relative Name and Phone Number:  Frann RiderJohn Curtis, son, [phone]    Current Level of Care: Hospital Recommended Level of Care: Skilled Nursing Facility Prior Approval Number:    Date Approved/Denied:   PASRR Number: [id number]  Discharge Plan: SNF    Current Diagnoses: Patient Active Problem List   Diagnosis Date Noted  . Fall from standing 07/09/2017    Orientation RESPIRATION BLADDER Height & Weight     Self, Situation  O2(Nasal Cannula 2L) Continent Weight: 93 lb (42.2 kg) Height:  5\' 1"  (154.9 cm)  BEHAVIORAL SYMPTOMS/MOOD NEUROLOGICAL BOWEL NUTRITION STATUS      Continent Diet(See DC summary)  AMBULATORY STATUS COMMUNICATION OF NEEDS Skin   Extensive Assist Verbally Surgical wounds                       Personal Care Assistance Level of Assistance  Bathing, Dressing, Feeding Bathing Assistance: Limited assistance Feeding assistance: Independent Dressing Assistance: Limited assistance     Functional Limitations Info  Sight, Hearing, Speech Sight Info: Adequate Hearing Info: Adequate Speech Info: Adequate    SPECIAL CARE FACTORS FREQUENCY  PT (By licensed PT), OT (By licensed OT)     PT Frequency: 5x week  OT Frequency: 5x week            Contractures Contractures Info: Not present    Additional Factors Info  Code Status, Allergies Code Status Info: Full  Allergies Info: DARVON PROPOXYPHENE, HYDROCODONE, LACTOSE, OTHER, SULFA ANTIBIOTICS, VALSARTAN            Current Medications (07/12/2017):  This is the current hospital  active medication list Current Facility-Administered Medications  Medication Dose Route Frequency Provider Last Rate Last Dose  . acetaminophen (TYLENOL) tablet 650 mg  650 mg Oral Q6H PRN Naida Sleightwens, James M, PA-C       Or  . acetaminophen (TYLENOL) suppository 650 mg  650 mg Rectal Q6H PRN Naida Sleightwens, James M, PA-C      . acetaminophen (TYLENOL) tablet 650 mg  650 mg Oral Q4H PRN Andria MeuseWhite, Christopher M, MD   650 mg at 07/11/17 0221  . amLODipine (NORVASC) tablet 7.5 mg  7.5 mg Oral Daily Andria MeuseWhite, Christopher M, MD   7.5 mg at 07/10/17 0801  . Chlorhexidine Gluconate Cloth 2 % PADS 6 each  6 each Topical Daily Eldred MangesYates, Mark C, MD   6 each at 07/11/17 1349  . docusate sodium (COLACE) capsule 200 mg  200 mg Oral BID Andria MeuseWhite, Christopher M, MD   200 mg at 07/11/17 2142  . feeding supplement (BOOST / RESOURCE BREEZE) liquid 1 Container  1 Container Oral TID BM Meuth, Brooke A, PA-C   1 Container at 07/11/17 2003  . hydrALAZINE (APRESOLINE) injection 10 mg  10 mg Intravenous Q6H PRN Meuth, Brooke A, PA-C   10 mg at 07/10/17 1151  . HYDROmorphone (DILAUDID) injection 0.3 mg  0.3 mg Intravenous Q3H PRN Andria MeuseWhite, Christopher M, MD   0.3 mg at 07/12/17 0458  . lactated ringers infusion   Intravenous Continuous Andria MeuseWhite, Christopher M, MD 75 mL/hr at  07/12/17 0152    . lactated ringers infusion   Intravenous Continuous Cristela BlueJackson, Kyle, MD 10 mL/hr at 07/11/17 1412    . magnesium hydroxide (MILK OF MAGNESIA) suspension 30 mL  30 mL Oral Once Andria MeuseWhite, Christopher M, MD      . menthol-cetylpyridinium (CEPACOL) lozenge 3 mg  1 lozenge Oral PRN Naida Sleightwens, James M, PA-C       Or  . phenol (CHLORASEPTIC) mouth spray 1 spray  1 spray Mouth/Throat PRN Naida Sleightwens, James M, PA-C      . metoprolol tartrate (LOPRESSOR) tablet 25 mg  25 mg Oral BID Meuth, Brooke A, PA-C   25 mg at 07/11/17 2143  . mupirocin ointment (BACTROBAN) 2 % 1 application  1 application Nasal BID Eldred MangesYates, Mark C, MD   1 application at 07/11/17 2147  . ondansetron (ZOFRAN-ODT)  disintegrating tablet 4 mg  4 mg Oral Q6H PRN Andria MeuseWhite, Christopher M, MD       Or  . ondansetron [facility](ZOFRAN) injection 4 mg  4 mg Intravenous Q6H PRN Andria MeuseWhite, Christopher M, MD   4 mg at 07/10/17 0800  . ondansetron (ZOFRAN) tablet 4 mg  4 mg Oral Q6H PRN Naida Sleightwens, James M, PA-C       Or  . ondansetron [facility](ZOFRAN) injection 4 mg  4 mg Intravenous Q6H PRN Zonia Kiefwens, James M, PA-C      . polyethylene glycol (MIRALAX / GLYCOLAX) packet 17 g  17 g Oral Daily Andria MeuseWhite, Christopher M, MD   17 g at 07/10/17 0801  . sodium phosphate (FLEET) 7-19 GM/118ML enema 1 enema  1 enema Rectal Once Meuth, Brooke A, PA-C      . traMADol (ULTRAM) tablet 50 mg  50 mg Oral BID Andria MeuseWhite, Christopher M, MD   50 mg at 07/11/17 2143     Discharge Medications: Please see discharge summary for a list of discharge medications.  Relevant Imaging Results:  Relevant Lab Results:   Additional Information SS#: [id number]  Tresa MoorePatricia V Harolyn Cocker, LCSW

## 2017-07-12 NOTE — Progress Notes (Signed)
P.T. recommends SNF. I have alerted RN CM and SW that we will not pursue an inpt rehab admit at this time. We will sign off. [phone]

## 2017-07-12 NOTE — Progress Notes (Signed)
I await PT and OT evals to assist with dispo. [phone]

## 2017-07-12 NOTE — Evaluation (Signed)
Physical Therapy Evaluation Patient Details Name: Misty RedbirdJoan [name] MRN: [id number] DOB: 12/27/1934 Today's Date: 07/12/2017   History of Present Illness  [name] a 82 y.o.femalepresenting with AMS. PMH is significant for HTN, DM, HLD, treated hep c, gout, chronic renal insufficiency s/p renal transplant.Pt was found to have UTI.  Clinical Impression  Pt admitted with above diagnosis. Pt currently with functional limitations due to the deficits listed below (see PT Problem List). At the time of PT eval pt appears very painful and with high anxiety to mobilize. Continually asking for "27 men to come help" her instead of the female therapist and tech present in room. +2 max assist required for transfer bed to chair. At this time, I do not feel that pt will be able to tolerate the intensity of CIR, and SNF may be a more appropriate d/c disposition. Will continue to follow, and if tolerance for functional activity improves will alert rehab admissions coordinator to assess. Pt will benefit from skilled PT to increase their independence and safety with mobility to allow discharge to the venue listed below.       Follow Up Recommendations SNF;Supervision/Assistance - 24 hour    Equipment Recommendations  None recommended by PT    Recommendations for Other Services       Precautions / Restrictions Precautions Precautions: Fall Restrictions Weight Bearing Restrictions: Yes RUE Weight Bearing: Non weight bearing RLE Weight Bearing: Non weight bearing(WBAT for transfers per Dr. Ophelia CharterYates)      Mobility  Bed Mobility Overal bed mobility: Needs Assistance Bed Mobility: Supine to Sit     Supine to sit: Max assist;+2 for physical assistance;HOB elevated     General bed mobility comments: Pt required assist for all aspects of bed mobility. Bed pad utilized to assist in scooting activity to EOB. Therapist held RLE out for support as she was scooted fully to EOB and could rest foot on floor.    Transfers Overall transfer level: Needs assistance Equipment used: 2 person hand held assist Transfers: Sit to/from [employer]GI CorporationStand;Stand Pivot Transfers Sit to Stand: Max assist;+2 physical assistance;+2 safety/equipment Stand pivot transfers: Max assist;+2 physical assistance       General transfer comment: Pt required max assist for power-up to full stand. Second person assisted pt in hand placement to chair and pivoting around to the chair.   Ambulation/Gait                Stairs            Wheelchair Mobility    Modified Rankin (Stroke Patients Only)       Balance Overall balance assessment: Needs assistance Sitting-balance support: Feet supported;No upper extremity supported Sitting balance-Leahy Scale: Poor     Standing balance support: No upper extremity supported;During functional activity Standing balance-Leahy Scale: Zero                               Pertinent Vitals/Pain Pain Assessment: Faces Faces Pain Scale: Hurts whole lot Pain Location: R leg, R shoulder Pain Descriptors / Indicators: Operative site guarding;Aching;Discomfort;Moaning Pain Intervention(s): Limited activity within patient's tolerance;Monitored during session;Repositioned    Home Living Family/patient expects to be discharged to:: Unsure Living Arrangements: Children;Other relatives                    Prior Function Level of Independence: Independent with assistive device(s)         Comments: Pt unable to relate her  PLOF, however per chart review pt [facility] (refused the [facility]W at home), and took care of her 2 great grandchildren.     Hand Dominance        Extremity/Trunk Assessment   Upper Extremity Assessment Upper Extremity Assessment: RUE deficits/detail RUE Deficits / Details: NWB in sling RUE: Unable to fully assess due to pain;Unable to fully assess due to immobilization    Lower Extremity Assessment Lower Extremity  Assessment: RLE deficits/detail RLE Deficits / Details: Acute pain, decreased strength and AROM consistent with above mentioned procedure.  RLE: Unable to fully assess due to pain    Cervical / Trunk Assessment Cervical / Trunk Assessment: Kyphotic  Communication   Communication: No difficulties  Cognition Arousal/Alertness: Awake/alert Behavior During Therapy: Anxious Overall Cognitive Status: No family/caregiver present to determine baseline cognitive functioning                                 General Comments: Pt admits to STM deficits however states "I know what my name is, where I'm at, what time it is, and why I'm here", however at several points throughout the session pt asking if she broke her leg, if it's still broken, and which leg did she break?       General Comments      Exercises     Assessment/Plan    PT Assessment Patient needs continued PT services  PT Problem List Decreased strength;Decreased range of motion;Decreased activity tolerance;Decreased balance;Decreased mobility;Decreased knowledge of use of DME;Decreased safety awareness;Decreased knowledge of precautions;Pain       PT Treatment Interventions DME instruction;Gait training;Functional mobility training;Stair training;Therapeutic activities;Therapeutic exercise;Neuromuscular re-education;Patient/family education    PT Goals (Current goals can be found in the Care Plan section)  Acute Rehab PT Goals Patient Stated Goal: Decrease pain PT Goal Formulation: Patient unable to participate in goal setting Time For Goal Achievement: 07/26/17 Potential to Achieve Goals: Fair    Frequency Min 3X/week   Barriers to discharge        Co-evaluation               AM-PAC PT "6 Clicks" Daily Activity  Outcome Measure Difficulty turning over in bed (including adjusting bedclothes, sheets and blankets)?: Unable Difficulty moving from lying on back to sitting on the side of the bed? :  Unable Difficulty sitting down on and standing up from a chair with arms (e.g., wheelchair, bedside commode, etc,.)?: Unable Help needed moving to and from a bed to chair (including a wheelchair)?: Total Help needed walking in hospital room?: Total Help needed climbing 3-5 steps with a railing? : Total 6 Click Score: 6    End of Session Equipment Utilized During Treatment: Gait belt;Oxygen Activity Tolerance: Patient limited by pain(and anxiety) Patient left: in chair;with call bell/phone within reach;with chair alarm set Nurse Communication: Mobility status PT Visit Diagnosis: Unsteadiness on feet (R26.81);Pain;Difficulty in walking, not elsewhere classified (R26.2) Pain - Right/Left: Right Pain - part of body: Shoulder;Hip    Time: 1030-1105 PT Time Calculation (min) (ACUTE ONLY): 35 min   Charges:   PT Evaluation $PT Eval High Complexity: 1 High PT Treatments $Gait Training: 8-22 mins   PT G Codes:        Conni SlipperLaura Iver Miklas, PT, DPT Acute Rehabilitation Services Pager: [phone]   Marylynn PearsonLaura D Brittiny Levitz 07/12/2017, 2:48 PM

## 2017-07-12 NOTE — Progress Notes (Signed)
Patient ID: Bernette RedbirdJoan Lammert, female   DOB: 11/07/1934, 82 y.o.   MRN: [id number]  [facility] Progress Note  1 Day Post-Op  Subjective: CC-  Patient states that she feels a little off today. Not complaining of much pain, but is tired and feeling a little nauseated, no emesis. Still has not had a BM.  Nervous about getting OOB today.  Objective: Vital signs in last 24 hours: Temp:  [97.1 F (36.2 C)-98.1 F (36.7 C)] 97.5 F (36.4 C) (12/20 0434) Pulse Rate:  [77-98] 82 (12/20 0434) Resp:  [12-19] 16 (12/20 0434) BP: (109-138)/(62-85) 128/66 (12/20 0434) SpO2:  [93 %-100 %] 96 % (12/20 0434) Weight:  [93 lb (42.2 kg)] 93 lb (42.2 kg) (12/19 1409) Last BM Date: 07/09/17  Intake/Output from previous day: 12/19 0701 - 12/20 0700 In: 2389.7 [P.O.:360; I.V.:1979.7; IV Piggyback:50] Out: 1050 [Urine:1000; Blood:50] Intake/Output this shift: No intake/output data recorded.  PE: Gen:  Alert, NAD, pleasant HEENT: EOM's intact, pupils equal and round. Ecchymosis anterior R lateral forehead, improving Card:  RRR, no M/G/R heard Pulm:  CTAB, no W/R/R, effort normal Abd: Soft, NT/ND, +BS, no HSM, no hernia RLE: dry dressing right lateral hip. Foot WWP, 2+ DP pulse, able to wiggle toes RUE: in sling, fingers WWP, 2+ radial pulse, able to wiggle fingers Psych: alert and oriented to place and self, states that its 1998 Skin: warm and dry  Lab Results:  Recent Labs    07/11/17 0611 07/12/17 0657  WBC 9.2 10.9*  HGB 10.6* 10.1*  HCT 32.8* 30.5*  PLT 249 286   BMET Recent Labs    07/11/17 0611 07/12/17 0657  NA 132* 133*  K 3.7 4.0  CL 98* 99*  CO2 25 25  GLUCOSE 98 121*  BUN 14 14  CREATININE 0.43* 0.45  CALCIUM 8.3* 8.1*   PT/INR No results for input(s): LABPROT, INR in the last 72 hours. CMP     Component Value Date/Time   NA 133 (L) 07/12/2017 0657   K 4.0 07/12/2017 0657   CL 99 (L) 07/12/2017 0657   CO2 25 07/12/2017 0657   GLUCOSE 121 (H)  07/12/2017 0657   BUN 14 07/12/2017 0657   CREATININE 0.45 07/12/2017 0657   CALCIUM 8.1 (L) 07/12/2017 0657   PROT 6.4 (L) 07/09/2017 0212   ALBUMIN 3.5 07/09/2017 0212   AST 22 07/09/2017 0212   ALT 18 07/09/2017 0212   ALKPHOS 73 07/09/2017 0212   BILITOT 0.6 07/09/2017 0212   GFRNONAA >60 07/12/2017 0657   GFRAA >60 07/12/2017 0657   Lipase  No results found for: LIPASE     Studies/Results: Dg C-arm 1-60 Min  Result Date: 07/11/2017 CLINICAL DATA:  Femur fracture fixation EXAM: OPERATIVE RIGHT HIP (WITH PELVIS IF PERFORMED) 2 VIEWS TECHNIQUE: Fluoroscopic spot image(s) were submitted for interpretation post-operatively. COMPARISON:  Hip radiography from 2 days ago FINDINGS: Right femoral nail fixation with dynamic hip screw. Anatomic fracture alignment. No evidence of intraoperative fracture. IMPRESSION: Fluoroscopy for intertrochanteric femur fracture fixation on the right. Electronically Signed   By: Marnee SpringJonathon  Watts M.D.   On: 07/11/2017 16:58   Dg Hip Operative Unilat With Pelvis Right  Result Date: 07/11/2017 CLINICAL DATA:  Femur fracture fixation EXAM: OPERATIVE RIGHT HIP (WITH PELVIS IF PERFORMED) 2 VIEWS TECHNIQUE: Fluoroscopic spot image(s) were submitted for interpretation post-operatively. COMPARISON:  Hip radiography from 2 days ago FINDINGS: Right femoral nail fixation with dynamic hip screw. Anatomic fracture alignment. No evidence of intraoperative fracture. IMPRESSION: Fluoroscopy  for intertrochanteric femur fracture fixation on the right. Electronically Signed   By: Marnee SpringJonathon  Watts M.D.   On: 07/11/2017 16:58    Anti-infectives: Anti-infectives (From admission, onward)   Start     Dose/Rate Route Frequency Ordered Stop   07/11/17 2200  ceFAZolin (ANCEF) IVPB 1 g/50 mL premix     1 g 100 mL/hr over 30 Minutes Intravenous Every 8 hours 07/11/17 1828 07/12/17 0531       Assessment/Plan HTN - continue home med norvasc 5mg , metoprolol 25mg  BID and hydralazine  PRN - HR and BP better Osteoporosis Chronic pain - takes tramadol at home, continue BID  Fall SDH/Concussion - per NS, stable and does not require repeat head CT unless change in neurological status. NS signed off R frontal scalp hematoma - ice prn R humeral neck fx - non-op management per Dr. Ophelia CharterYates, sling, NWB R intertrochanteric femur fracture - s/p IMN 12/19 Dr. Ophelia CharterYates, NWB  Constipation - continue miralax/colace, enema PRN ABL anemia - Hg 10.1 from 10.6, stable  ID - none FEN - heart healthy diet, Ensure, miralax/colace VTE - SCD, Lovenox  Plan - PT/OT. Decrease dilaudid, schedule tylenol and add robaxin and oxycodone PRN. CIR following. Labs in AM.   LOS: 3 days    Franne FortsBrooke A Meuth , [facility] 07/12/2017, 9:12 AM Pager: [phone] Consults: [phone] Mon-Fri 7:00 am-4:30 pm Sat-Sun 7:00 am-11:30 am

## 2017-07-13 ENCOUNTER — Inpatient Hospital Stay (HOSPITAL_COMMUNITY): Payer: Medicare Other

## 2017-07-13 DIAGNOSIS — E440 Moderate protein-calorie malnutrition: Secondary | ICD-10-CM

## 2017-07-13 LAB — BASIC METABOLIC PANEL
Anion gap: 7 (ref 5–15)
BUN: 15 mg/dL (ref 6–20)
CALCIUM: 8.1 mg/dL — AB (ref 8.9–10.3)
CHLORIDE: 100 mmol/L — AB (ref 101–111)
CO2: 28 mmol/L (ref 22–32)
CREATININE: 0.42 mg/dL — AB (ref 0.44–1.00)
Glucose, Bld: 92 mg/dL (ref 65–99)
Potassium: 3.8 mmol/L (ref 3.5–5.1)
SODIUM: 135 mmol/L (ref 135–145)

## 2017-07-13 LAB — CBC
HCT: 32.1 % — ABNORMAL LOW (ref 36.0–46.0)
HEMOGLOBIN: 10.4 g/dL — AB (ref 12.0–15.0)
MCH: 30.1 pg (ref 26.0–34.0)
MCHC: 32.4 g/dL (ref 30.0–36.0)
MCV: 93 fL (ref 78.0–100.0)
PLATELETS: 283 10*3/uL (ref 150–400)
RBC: 3.45 MIL/uL — ABNORMAL LOW (ref 3.87–5.11)
RDW: 14.5 % (ref 11.5–15.5)
WBC: 10.3 10*3/uL (ref 4.0–10.5)

## 2017-07-13 MED ORDER — APIXABAN 5 MG PO TABS
10.0000 mg | ORAL_TABLET | Freq: Two times a day (BID) | ORAL | Status: DC
  Administered 2017-07-13 – 2017-07-19 (×12): 10 mg via ORAL
  Filled 2017-07-13 (×12): qty 2

## 2017-07-13 MED ORDER — PRO-STAT SUGAR FREE PO LIQD
30.0000 mL | Freq: Two times a day (BID) | ORAL | Status: DC
  Administered 2017-07-13 – 2017-07-19 (×8): 30 mL via ORAL
  Filled 2017-07-13 (×11): qty 30

## 2017-07-13 MED ORDER — IOPAMIDOL (ISOVUE-370) INJECTION 76%
INTRAVENOUS | Status: AC
  Administered 2017-07-13: 100 mL
  Filled 2017-07-13: qty 100

## 2017-07-13 MED ORDER — METHOCARBAMOL 500 MG PO TABS
500.0000 mg | ORAL_TABLET | Freq: Three times a day (TID) | ORAL | Status: DC
  Administered 2017-07-13 – 2017-07-19 (×19): 500 mg via ORAL
  Filled 2017-07-13 (×19): qty 1

## 2017-07-13 MED ORDER — IPRATROPIUM-ALBUTEROL 0.5-2.5 (3) MG/3ML IN SOLN
3.0000 mL | Freq: Four times a day (QID) | RESPIRATORY_TRACT | Status: DC
  Administered 2017-07-13 (×2): 3 mL via RESPIRATORY_TRACT
  Filled 2017-07-13 (×3): qty 3

## 2017-07-13 MED ORDER — OXYCODONE HCL 5 MG PO TABS
5.0000 mg | ORAL_TABLET | ORAL | Status: DC | PRN
  Administered 2017-07-13 – 2017-07-19 (×20): 5 mg via ORAL
  Filled 2017-07-13 (×23): qty 1

## 2017-07-13 NOTE — Progress Notes (Addendum)
Pt transferred to 4N24 report called to Sarah, pt transported via unit bed with SWOT/RRT RN and NT x1 at bedside.  AKingRNBSN

## 2017-07-13 NOTE — Clinical Social Work Note (Signed)
Clinical Social Worker continuing to follow patient and family for support and discharge planning needs.  Patient has been transferred to the ICU with worsening SOB.  CSW to provide patient and patient son with available bed offers once medically appropriate.  CSW remains available for support and to facilitate patient discharge needs once medically stable.  Macario GoldsJesse Christina Waldrop, [place] [phone]

## 2017-07-13 NOTE — Progress Notes (Signed)
Radiology called with report that patient was positive for a right sided pulmonary embolism. Trauma MD paged. Awaiting new orders. Niccolo Burggraf, [place] ScrapeSarah E, RN

## 2017-07-13 NOTE — Progress Notes (Signed)
Patient ID: Misty [name], female   DOB: 01/06/1935, 82 y.o.   MRN: [id number] I D/W Dr. Yetta BarreJones - acute PE with HX TBI. I D/W Dr. Yetta BarreJones and he rec repeat CT head before starting anticoagulation. Misty GelinasBurke Lashun Mccants, MD, MPH, FACS Trauma: [phone] General Surgery: [phone]

## 2017-07-13 NOTE — Progress Notes (Signed)
Initial Nutrition Assessment  DOCUMENTATION CODES:  Underweight, Non-severe (moderate) malnutrition in context of chronic illness  INTERVENTION:  Continue Boost Breeze TID, each supplement provides 250 kcal and 9 grams of protein  Will order 30 mL Prostat BID, each supplement provides 100 kcal and 15 grams of protein.  NUTRITION DIAGNOSIS:  Inadequate oral intake related to lethargy/confusion, decreased appetite as evidenced by meal completion < 50%.  GOAL:  Patient will meet greater than or equal to 90% of their needs  MONITOR:  PO intake, Supplement acceptance, Labs, Weight trends  REASON FOR ASSESSMENT:  Malnutrition Screening Tool    ASSESSMENT:  82 y/o female PMHx HTN. Fell at home, suffering concussion, Right SDH, R scalp hematoma and fractures to R humerus and femur s/p nail fixation of Right hip fx 12/19. POD2, pt suffered from acute SOB. CT revealed new PE. Transferred to ICU.   Pt seen alone. She is alert, but quite confused. Unable to obtain much information as she would just talk about her family.   Of pertinence, RD noted she had Ensure on table. RD asked what she thought of those. She says she cannot tolerate Ensure because it gives her diarrhea. She has also been receiving Boost breeze and appears to be drinking this fairly consistently.   Per intake hx, she has had minimal intake since admitted. Documentation shows she has never had more than 25% of a meal with the exception of yesterdays Lunch when she had 50%.    She says she has not been eating because "I forgot you had to eat when you were in the hospital". RD unable to obtain any food preferences.   She gave several different UBWs throughout the conversation. Per chart and "Care Everywhere", she weighed 91 lbs this past October and 93 lbs at the end of November. Unknown if she has had chronic weight loss.  Bed weight today was 49.8 kg.   Physical Exam: Mild muscle wasting of temporalis, interosseus. Moderate  muscle wasting of clavicular musculature and deltoids. Mild fat wasting of orbitals.   Labs: Reviewed. Nothing nutritionally relevant.  Meds: Colace, MOM, Resource Adam PhenixBreeze, Miralax  Recent Labs  Lab 07/11/17 [phone] 07/12/17 0657 07/13/17 0358  NA 132* 133* 135  K 3.7 4.0 3.8  CL 98* 99* 100*  CO2 25 25 28   BUN 14 14 15   CREATININE 0.43* 0.45 0.42*  CALCIUM 8.3* 8.1* 8.1*  GLUCOSE 98 121* 92   NUTRITION - FOCUSED PHYSICAL EXAM:   Most Recent Value  Orbital Region  Mild depletion  Upper Arm Region  Mild depletion  Thoracic and Lumbar Region  Moderate depletion  Buccal Region  Mild depletion  Temple Region  Mild depletion  Clavicle Bone Region  Moderate depletion  Clavicle and Acromion Bone Region  Moderate depletion  Scapular Bone Region  Unable to assess  Dorsal Hand  Moderate depletion  Patellar Region  Unable to assess  Anterior Thigh Region  Unable to assess  Posterior Calf Region  Unable to assess  Edema (RD Assessment)  None       Diet Order:  Diet Heart Room service appropriate? Yes; Fluid consistency: Thin  EDUCATION NEEDS:  No education needs have been identified at this time  Skin:  Surgical Incision to R leg, Large Bruise to R forehead.   Last BM:  12/17  Height:  Ht Readings from Last 1 Encounters:  07/11/17 5\' 1"  (1.549 m)   Weight:  Wt Readings from Last 1 Encounters:  07/11/17 93 lb (42.2  kg)   Ideal Body Weight:  47.73 kg  BMI:  Body mass index is 17.57 kg/m.  Estimated Nutritional Needs:  Kcal:  1500-1700 (34-40 kcal/kg bw) Protein:  60-72 g Pro (1.4-1.7 g/kg bw) Fluid:  >1.1 l (25 ml/kg bw)  Christophe LouisNathan Zionah Criswell RD, LDN, CNSC Clinical Nutrition Pager: [phone] 07/13/2017 3:40 PM

## 2017-07-13 NOTE — Discharge Summary (Signed)
[facility] Discharge Summary   Patient ID: Misty RedbirdJoan Ripp MRN: [id number] DOB/AGE: 80/08/1934 80 y.o.  Admit date: 07/09/2017 Discharge date: 07/19/2017  Discharge Diagnosis: Acute right SDH (2mm) without midline shift Small amount of intraventricular blood Right frontal scalp hematoma Right humeral neck fracture Right intertrochanteric right femur fracture Constipation Pulmonary embolism  Consultants Neurosurgery Orthopedics  Imaging: Dg Ankle Complete Right  Result Date: 07/17/2017 CLINICAL DATA:  Right ankle pain after fall 10 days ago. EXAM: RIGHT ANKLE - COMPLETE 3+ VIEW COMPARISON:  None. FINDINGS: There is no evidence of fracture, dislocation, or joint effusion. There is no evidence of arthropathy or other focal bone abnormality. Soft tissues are unremarkable. IMPRESSION: Negative. Electronically Signed   By: Lupita RaiderJames  Green Jr, M.D.   On: 07/17/2017 19:33    Procedures Dr. Ophelia CharterYates (07/11/17) - Right trochanteric nail fixation of right intertrochanteric hip fracture  Hospital Course:  Misty RedbirdJoan [name] is an 82yo female PMH osteoporisis who [facility] suffering a ground level fall at home.  Was in her bedroom at home with her son whom she lives with - fell in the middle of the night. EMS was called. Nonambulatory on scene. She was transported here. Complains of pain in her left arm, left hip and left forehead.  Unsure if LOC. Workup showed small SDH, small amount of intraventricular blood, right frontal scalp hematoma, right humeral neck fracture, and right intertrochanteric femur fracture.  Patient was admitted to the trauma service. She developed hypertension and tachycardia, started on metoprolol and BP/HR improved.  Neurosurgery was consulted for TBI and recommended nonoperatively management. Orthopedics was consulted for femur and humerus fractures; the proximal humerus fracture was treated nonoperatively and patient underwent nail fixation of the  intertrochanteric femur fracture on 12/19.  Tolerated procedure well and was transferred to the floor.  Patient became tachycardic and hypoxic 12/21; CT chest ordered and showed right sided pulmonary embolism. Repeat head CT 12/21 was stable and patient was started on eliquis for PE after discussion with neurosurgery. Patient complained of some chest pain 12/25, EKG remained stable. Patient also noted right knee pain 12/25, x-ray negative for fracture but showed mild effusion.   Patient worked with therapies during this admission. On 07/20/17, the patient was voiding well, tolerating diet, ambulating well, pain well controlled, vital signs stable and felt stable for discharge to SNF.  Patient will follow up as below and knows to call with questions or concerns.    I have personally reviewed the patients medication history on the  controlled substance database.    Allergies as of 07/19/2017      Reactions   Darvon [propoxyphene] Other (See Comments)   Unknown allergic reaction   Hydrocodone Nausea And Vomiting   Lactose Other (See Comments)   Unknown allergic reaction   Other Other (See Comments)   Unknown allergic reaction to sodium pentothal   Sulfa Antibiotics Other (See Comments)   Unknown allergic reaction   Valsartan Other (See Comments)   Unknown allergic reaction - [facility] 05/12/17      Medication List    TAKE these medications   amLODipine 5 MG tablet Commonly known as:  NORVASC Take 7.5 mg by mouth daily.   apixaban 5 MG Tabs tablet Commonly known as:  ELIQUIS Take 2 tablets (10 mg total) by mouth 2 (two) times daily.   calcitonin (salmon) 200 UNIT/ACT nasal spray Commonly known as:  MIACALCIN/FORTICAL Place 1 spray into alternate nostrils daily.   dicyclomine 10  MG capsule Commonly known as:  BENTYL Take 10 mg by mouth daily.   gabapentin 100 MG capsule Commonly known as:  NEURONTIN Take 1 capsule (100 mg total) by mouth 2 (two) times  daily.   loratadine 10 MG tablet Commonly known as:  CLARITIN Take 10 mg by mouth daily.   methocarbamol 500 MG tablet Commonly known as:  ROBAXIN Take 1 tablet (500 mg total) by mouth 3 (three) times daily.   metoprolol tartrate 25 MG tablet Commonly known as:  LOPRESSOR Take 1 tablet (25 mg total) by mouth 2 (two) times daily.   nystatin 100000 UNIT/ML suspension Commonly known as:  MYCOSTATIN Take 5 mLs (500,000 Units total) by mouth 4 (four) times daily.   ondansetron 4 MG disintegrating tablet Commonly known as:  ZOFRAN-ODT Take 1 tablet (4 mg total) by mouth every 6 (six) hours as needed for nausea.   oxyCODONE 5 MG immediate release tablet Commonly known as:  Oxy IR/ROXICODONE Take 1 tablet (5 mg total) by mouth every 4 (four) hours as needed for moderate pain or severe pain.   polyethylene glycol packet Commonly known as:  MIRALAX / GLYCOLAX Take 17 g by mouth daily. Start taking on:  07/20/2017   traMADol 50 MG tablet Commonly known as:  ULTRAM Take 1 tablet (50 mg total) by mouth every 8 (eight) hours. What changed:  when to take this         Contact information for follow-up providers    Eldred MangesYates, Mark C, MD Follow up in 2 week(s).   Specialty:  Orthopedic Surgery Why:  in regards to your recent orthopedic surgery and your fractured shoulder Contact information: [address] [place] [address] [phone]        [facility] Follow up.   Why:  Call as needed, no follow up appointment needed Contact information: [address] [place] [phone]           Contact information for after-discharge care    Destination    [facility] Follow up.   Service:  Skilled Nursing Contact information: [address] [place] [phone]                  Signed: Wells GuilesKelly Rayburn , [facility] 07/19/2017, 3:13 PM Pager: [phone] Consults: [phone] Mon-Fri 7:00 am-4:30 pm Sat-Sun 7:00 am-11:30 am

## 2017-07-13 NOTE — Progress Notes (Signed)
Patient ID: Misty RedbirdJoan Silvera, female   DOB: 01/08/1935, 82 y.o.   MRN: [id number] C/O sudden onset SOB Sats 94 on FM O2 CXR P Will get CT angio chest to R/O PE Transfer to ICU  Violeta GelinasBurke Lidie Glade, MD, MPH, FACS Trauma: [phone] General Surgery: [phone]

## 2017-07-13 NOTE — Progress Notes (Signed)
Patient ID: Misty [name], female   DOB: 12/12/1934, 82 y.o.   MRN: [id number]  [facility]  2 Days Post-Op  Subjective: CC- R hip pain Patient states that she is having more pain in her hip. It is worse with movement. Denies n/t.  Only pulling 250 on IS. Denies CP or SOB. Still not eating very much, but is tolerating what she is eating. Drank Boost yesterday. Passing flatus, still has not had a BM. Refused enema yesterday. States that she thinks she will have a BM soon. Worked with PT, recommending SNF.  Objective: Vital signs in last 24 hours: Temp:  [98 F (36.7 C)-98.7 F (37.1 C)] 98 F (36.7 C) (12/21 0352) Pulse Rate:  [80-90] 80 (12/21 0352) Resp:  [17-18] 18 (12/21 0352) BP: (122-136)/(62-66) 133/65 (12/21 0352) SpO2:  [92 %-96 %] 92 % (12/21 0352) Last BM Date: 07/09/17  Intake/Output from previous day: 12/20 0701 - 12/21 0700 In: 2693.1 [P.O.:476; I.V.:2217.1] Out: 150 [Urine:150] Intake/Output this shift: No intake/output data recorded.  PE: Gen: Alert, NAD, pleasant HEENT: EOM's intact, pupils equal and round. Ecchymosis anterior R lateral forehead, improving Card: RRR, no M/G/R heard Pulm: CTAB, no W/R/R, effort normal Abd: Soft, NT/ND, +BS, no HSM, no hernia RLE: dry dressing right lateral hip. Foot WWP, 2+ DP pulse, able to wiggle toes RUE: in sling, fingers WWP, 2+ radial pulse, able to wiggle fingers BLE: calves soft Psych: alert and oriented to place and self, states that its 1991 Skin: warm and dry  Lab Results:  Recent Labs    07/12/17 0657 07/13/17 0358  WBC 10.9* 10.3  HGB 10.1* 10.4*  HCT 30.5* 32.1*  PLT 286 283   BMET Recent Labs    07/12/17 0657 07/13/17 0358  NA 133* 135  K 4.0 3.8  CL 99* 100*  CO2 25 28  GLUCOSE 121* 92  BUN 14 15  CREATININE 0.45 0.42*  CALCIUM 8.1* 8.1*   PT/INR No results for input(s): LABPROT, INR in the last 72 hours. CMP     Component Value Date/Time   NA 135  07/13/2017 0358   K 3.8 07/13/2017 0358   CL 100 (L) 07/13/2017 0358   CO2 28 07/13/2017 0358   GLUCOSE 92 07/13/2017 0358   BUN 15 07/13/2017 0358   CREATININE 0.42 (L) 07/13/2017 0358   CALCIUM 8.1 (L) 07/13/2017 0358   PROT 6.4 (L) 07/09/2017 0212   ALBUMIN 3.5 07/09/2017 0212   AST 22 07/09/2017 0212   ALT 18 07/09/2017 0212   ALKPHOS 73 07/09/2017 0212   BILITOT 0.6 07/09/2017 0212   GFRNONAA >60 07/13/2017 0358   GFRAA >60 07/13/2017 0358   Lipase  No results found for: LIPASE     Studies/Results: Dg C-arm 1-60 Min  Result Date: 07/11/2017 CLINICAL DATA:  Femur fracture fixation EXAM: OPERATIVE RIGHT HIP (WITH PELVIS IF PERFORMED) 2 VIEWS TECHNIQUE: Fluoroscopic spot image(s) were submitted for interpretation post-operatively. COMPARISON:  Hip radiography from 2 days ago FINDINGS: Right femoral nail fixation with dynamic hip screw. Anatomic fracture alignment. No evidence of intraoperative fracture. IMPRESSION: Fluoroscopy for intertrochanteric femur fracture fixation on the right. Electronically Signed   By: Marnee SpringJonathon  Watts M.D.   On: 07/11/2017 16:58   Dg Hip Operative Unilat With Pelvis Right  Result Date: 07/11/2017 CLINICAL DATA:  Femur fracture fixation EXAM: OPERATIVE RIGHT HIP (WITH PELVIS IF PERFORMED) 2 VIEWS TECHNIQUE: Fluoroscopic spot image(s) were submitted for interpretation post-operatively. COMPARISON:  Hip radiography from 2 days ago  FINDINGS: Right femoral nail fixation with dynamic hip screw. Anatomic fracture alignment. No evidence of intraoperative fracture. IMPRESSION: Fluoroscopy for intertrochanteric femur fracture fixation on the right. Electronically Signed   By: Marnee SpringJonathon  Watts M.D.   On: 07/11/2017 16:58    Anti-infectives: Anti-infectives (From admission, onward)   Start     Dose/Rate Route Frequency Ordered Stop   07/11/17 2200  ceFAZolin (ANCEF) IVPB 1 g/50 mL premix     1 g 100 mL/hr over 30 Minutes Intravenous Every 8 hours 07/11/17 1828  07/12/17 0531       Assessment/Plan HTN - continue home med norvasc 5mg , metoprolol 25mg  BID and hydralazine PRN- HR and BP better Osteoporosis Chronic pain - takes tramadol at home, continue BID  Fall SDH/Concussion - per NS, stable and does not require repeat head CT unless change in neurological status. NS signed off R frontal scalp hematoma - ice prn R humeral neck fx -non-op management per Dr. Ophelia CharterYates, sling, NWB RUE R intertrochanteric femur fracture -s/p IMN 12/19 Dr. Ophelia CharterYates, NWB RLE Constipation - continue miralax/colace, patient refused enema ABL anemia- Hg 10.4 from 10.1, stable  ID - none FEN - heart healthy diet, Ensure, miralax/colace VTE - SCD, Lovenox Foley - wick  Plan - Schedule robaxin and increase oxy to q4 hours PRN. Continue PT/OT. SNF. May need to offer enema again if patient does not have a BM. RN looking into accuracy of UOP recorded (150). Creatinine stable.   LOS: 4 days    Franne FortsBrooke A Marsean Elkhatib , [facility] 07/13/2017, 8:12 AM Pager: [phone] Consults: [phone] Mon-Fri 7:00 am-4:30 pm Sat-Sun 7:00 am-11:30 am

## 2017-07-13 NOTE — Progress Notes (Addendum)
PT and this nurse at bedside for transfer to chair.  Pt not tolerating well, desatting into lower 80's. Pt advised to take deep breaths to bring SPO2 up, with minimal results. O2 increased to 4L, spoke with RT Emily to tube up venturi mask, when this nurse returned to pt room, OT placed non re breather mask and pt SPO2 up to 95- 97%.  Charge nurse aware, call placed to primary team to notify.  AKing RNBSN

## 2017-07-13 NOTE — Evaluation (Signed)
Occupational Therapy Evaluation Patient Details Name: Misty [name] MRN: [id number] DOB: March 02, 1935 Today's Date: 07/13/2017    History of Present Illness Misty [name] a 82 y.o.femalepresenting with AMS. PMH is significant for HTN, DM, HLD, treated hep c, gout, chronic renal insufficiency s/p renal transplant.Pt was found to have UTI.   Clinical Impression   Pt c/o SOB with chest pain elevated BP and decr oxygen during our evaluation. Pt noted to have incr HR 119-136 during session. Pt with oxygen showing 75% 2 L and 97% on non rebreather. Pt requires total +2 (A) for all transfer and total (A) for adls at this time. Recommend SNf for d/c planning. RN at bedside addressing on going symptoms.      Follow Up Recommendations  SNF    Equipment Recommendations  Other (comment)(TBA)    Recommendations for Other Services       Precautions / Restrictions Precautions Precautions: Fall Restrictions Weight Bearing Restrictions: Yes RUE Weight Bearing: Non weight bearing RLE Weight Bearing: Non weight bearing      Mobility Bed Mobility Overal bed mobility: Needs Assistance Bed Mobility: Rolling;Supine to Sit Rolling: +2 for physical assistance;Total assist   Supine to sit: +2 for physical assistance;Max assist;HOB elevated     General bed mobility comments: pt with total (A) bil LE to swing to EOB with pad with full support at spine to turn UB. pt screaming in pain with and without contact from therapist  Transfers Overall transfer level: Needs assistance Equipment used: 2 person hand held assist Transfers: Sit to/from Stand;Stand Pivot Transfers Sit to Stand: +2 physical assistance;Max assist Stand pivot transfers: +2 physical assistance;Max assist       General transfer comment: Pt needed encouragement to transfer to chair on the Left side with blocking of knees. pt with c/o chest pain. vitals taken again    Balance Overall balance assessment: Needs  assistance Sitting-balance support: Bilateral upper extremity supported;Feet supported Sitting balance-Leahy Scale: Poor     Standing balance support: Bilateral upper extremity supported;During functional activity Standing balance-Leahy Scale: Zero                             ADL either performed or assessed with clinical judgement   ADL Overall ADL's : Needs assistance/impaired Eating/Feeding: Minimal assistance                                     General ADL Comments: pt currently requires total (A) for all bathing/ dressing and transfers. pt required total +2 transfer to chair this session with SOB and c/o chest pain and nausea. Vitals obtained see notes     Vision   Vision Assessment?: No apparent visual deficits     Perception     Praxis      Pertinent Vitals/Pain Pain Assessment: Faces Faces Pain Scale: Hurts whole lot Pain Location: R leg, R shoulder, all over  Pain Descriptors / Indicators: Operative site guarding;Aching;Discomfort;Moaning Pain Intervention(s): Monitored during session;Premedicated before session;Repositioned;Limited activity within patient's tolerance     Hand Dominance Right   Extremity/Trunk Assessment Upper Extremity Assessment Upper Extremity Assessment: RUE deficits/detail RUE Deficits / Details: NWB in sling   Lower Extremity Assessment Lower Extremity Assessment: RLE deficits/detail RLE Deficits / Details: Acute pain, decreased strength and AROM consistent with above mentioned procedure.    Cervical / Trunk Assessment Cervical / Trunk Assessment: Kyphotic  Communication Communication Communication: No difficulties   Cognition Arousal/Alertness: Awake/alert Behavior During Therapy: Impulsive;Anxious;Restless Overall Cognitive Status: No family/caregiver present to determine baseline cognitive functioning                                 General Comments: pt very anxious entire session and  ask "im i dying?" chart notes previous TBI and currently with bruising to forehead   General Comments  pt sitting BP 155/124 HR 136. pt with decr oxygen saturation 83 % on 4 L HR 122. pt with prolonged sitting 172/82 with c/o chest pain and breathing with incr Oxygen to 6L. pt with very cold hands so heat pack placed on L hand to help warm finger tips with close monitoring of vitals. Pt after > 10 minutes BP 155/76 pt placed on non rebreather 4L 97% oxygen     Exercises     Shoulder Instructions      Home Living Family/patient expects to be discharged to:: Skilled nursing facility Living Arrangements: Children;Other relatives                                      Prior Functioning/Environment Level of Independence: Independent with assistive device(s)                 OT Problem List: Decreased strength;Decreased activity tolerance;Impaired balance (sitting and/or standing);Decreased safety awareness;Decreased knowledge of use of DME or AE;Decreased knowledge of precautions;Pain;Cardiopulmonary status limiting activity;Decreased cognition      OT Treatment/Interventions: Self-care/ADL training;Therapeutic exercise;Energy conservation;DME and/or AE instruction;Therapeutic activities;Cognitive remediation/compensation;Patient/family education;Balance training    OT Goals(Current goals can be found in the care plan section) Acute Rehab OT Goals Patient Stated Goal: to get back in the bed OT Goal Formulation: With patient Time For Goal Achievement: 07/27/17 Potential to Achieve Goals: Good  OT Frequency: Min 2X/week   Barriers to D/C:            Co-evaluation PT/OT/SLP Co-Evaluation/Treatment: Yes Reason for Co-Treatment: Complexity of the patient's impairments (multi-system involvement);Necessary to address cognition/behavior during functional activity;To address functional/ADL transfers;For patient/therapist safety   OT goals addressed during session: ADL's  and self-care;Proper use of Adaptive equipment and DME;Strengthening/ROM      AM-PAC PT "6 Clicks" Daily Activity     Outcome Measure Help from another person eating meals?: A Little Help from another person taking care of personal grooming?: A Lot Help from another person toileting, which includes using toliet, bedpan, or urinal?: A Lot Help from another person bathing (including washing, rinsing, drying)?: A Lot Help from another person to put on and taking off regular upper body clothing?: A Lot Help from another person to put on and taking off regular lower body clothing?: A Lot 6 Click Score: 13   End of Session Equipment Utilized During Treatment: Gait belt;Rolling walker;Oxygen Nurse Communication: Mobility status;Precautions  Activity Tolerance: Patient limited by fatigue;Patient limited by pain Patient left: in chair;with call bell/phone within reach;with nursing/sitter in room;with family/visitor present  OT Visit Diagnosis: Unsteadiness on feet (R26.81);Pain;Muscle weakness (generalized) (M62.81) Pain - Right/Left: Right Pain - part of body: Shoulder(ribs)                Time: [id number] OT Time Calculation (min): 44 min Charges:  OT General Charges $OT Visit: 1 Visit OT Evaluation $OT Eval High Complexity: 1 High G-Codes:  Mateo FlowJones, Brynn   OTR/L Pager: [phone] Office: [phone] .   Boone MasterJones, Anselma Herbel B 07/13/2017, 3:50 PM

## 2017-07-13 NOTE — Progress Notes (Signed)
   Subjective: 2 Days Post-Op Procedure(s) (LRB): RIGHT TROCHANTERIC NAIL (Right) Patient reports pain as moderate.    Objective: Vital signs in last 24 hours: Temp:  [98 F (36.7 C)-98.7 F (37.1 C)] 98 F (36.7 C) (12/21 0352) Pulse Rate:  [80-90] 80 (12/21 0352) Resp:  [17-18] 18 (12/21 0352) BP: (122-136)/(62-66) 133/65 (12/21 0352) SpO2:  [92 %-96 %] 92 % (12/21 0352)  Intake/Output from previous day: 12/20 0701 - 12/21 0700 In: 2693.1 [P.O.:476; I.V.:2217.1] Out: 150 [Urine:150] Intake/Output this shift: No intake/output data recorded.  Recent Labs    07/11/17 0611 07/12/17 0657 07/13/17 0358  HGB 10.6* 10.1* 10.4*   Recent Labs    07/12/17 0657 07/13/17 0358  WBC 10.9* 10.3  RBC 3.32* 3.45*  HCT 30.5* 32.1*  PLT 286 283   Recent Labs    07/12/17 0657 07/13/17 0358  NA 133* 135  K 4.0 3.8  CL 99* 100*  CO2 25 28  BUN 14 15  CREATININE 0.45 0.42*  GLUCOSE 121* 92  CALCIUM 8.1* 8.1*   No results for input(s): LABPT, INR in the last 72 hours.  Neurologically intact No results found.  Assessment/Plan: 2 Days Post-Op Procedure(s) (LRB): RIGHT TROCHANTERIC NAIL (Right) Up with therapy . OOB to recliner. Office 2 wks to see Dr. Sabino SnipesYates  Misty [name] 07/13/2017, 9:29 AM

## 2017-07-13 NOTE — Progress Notes (Signed)
Physical Therapy Treatment Patient Details Name: Misty Misty [name] MRN: [id number] DOB: 04/08/1935 Today's Date: 07/13/2017    History of Present Illness Misty LovelyGloria J Pooleis a 82 y.o.femalepresenting with AMS. PMH is significant for HTN, DM, HLD, treated hep c, gout, chronic renal insufficiency s/p renal transplant.Misty [name] was found to have UTI.    Misty [name] Comments    Co-treat performed with OT due to patient status and for patient/provider safety. Patient received in bed this morning, frequently calling out and stating "I'm choking, I can't breathe", requiring Max verbal reassurance this session. Max assist x2 for supine to sit, Max assist x2 for stand pivot transfer to chair with knee blocking; patient continues to state she cannot breathe and remains very anxious and agitated. Note O2 at 79-82% in chair on nasal cannula; O2 increased to 6LPM however O2 saturations remained low, reaching 83-84% at best; HR ranging from 115-134 during session. RN present during session and is aware of patient status; patient ultimately placed on non-rebreather mask after which O2 saturations quickly increased to 100%. Patient left up in chair with nursing staff continuing to attend to patient.     Follow Up Recommendations  SNF;Supervision/Assistance - 24 hour     Equipment Recommendations  None recommended by Misty [name]    Recommendations for Other Services       Precautions / Restrictions Precautions Precautions: Fall Restrictions Weight Bearing Restrictions: Yes RUE Weight Bearing: Non weight bearing RLE Weight Bearing: Non weight bearing    Mobility  Bed Mobility Overal bed mobility: Needs Assistance Bed Mobility: Supine to Sit     Supine to sit: Max assist;+2 for physical assistance;HOB elevated     General bed mobility comments: patient continues to require assist for all bed mobility; bed pads used to facilitate mobility   Transfers Overall transfer level: Needs assistance Equipment used: 2 person hand  held assist Transfers: Stand Pivot Transfers;Sit to/from Stand Sit to Stand: Max assist;+2 physical assistance;+2 safety/equipment Stand pivot transfers: Max assist;+2 physical assistance       General transfer comment: Max assist +2 for sit to stand, knees blocked and max +2 for stand pivot transfer today   Ambulation/Gait             General Gait Details: DNT- unable due to patient status and safety concerns    Stairs            Wheelchair Mobility    Modified Rankin (Stroke Patients Only)       Balance Overall balance assessment: Needs assistance Sitting-balance support: Feet supported;Bilateral upper extremity supported Sitting balance-Leahy Scale: Poor     Standing balance support: No upper extremity supported;During functional activity Standing balance-Leahy Scale: Zero                              Cognition Arousal/Alertness: Awake/alert Behavior During Therapy: Anxious;Agitated Overall Cognitive Status: No family/caregiver present to determine baseline cognitive functioning                                 General Comments: patient very anxious this session       Exercises      General Comments        Pertinent Vitals/Pain Pain Assessment: Faces Faces Pain Scale: Hurts whole lot Pain Location: R leg, R shoulder, all over  Pain Descriptors / Indicators: Operative site guarding;Aching;Discomfort;Moaning Pain Intervention(s): Limited activity within patient's tolerance;Monitored during  session;RN gave pain meds during session    Home Living                      Prior Function            Misty [name] Goals (current goals can now be found in the care plan section) Acute Rehab Misty [name] Goals Patient Stated Goal: Decrease pain Misty [name] Goal Formulation: Patient unable to participate in goal setting Time For Goal Achievement: 07/26/17 Potential to Achieve Goals: Fair Progress towards Misty [name] goals: Not progressing toward goals -  comment(limited by anxiety and agitation, pain, medical status )    Frequency    Min 3X/week      Misty [name] Plan Current plan remains appropriate    Co-evaluation              AM-PAC Misty [name] "6 Clicks" Daily Activity  Outcome Measure  Difficulty turning over in bed (including adjusting bedclothes, sheets and blankets)?: Unable Difficulty moving from lying on back to sitting on the side of the bed? : Unable Difficulty sitting down on and standing up from a chair with arms (e.g., wheelchair, bedside commode, etc,.)?: Unable Help needed moving to and from a bed to chair (including a wheelchair)?: Total Help needed walking in hospital room?: Total Help needed climbing 3-5 steps with a railing? : Total 6 Click Score: 6    End of Session Equipment Utilized During Treatment: Gait belt;Oxygen Activity Tolerance: Patient limited by pain(and anxiety ) Patient left: in chair;with nursing/sitter in room;with chair alarm set Nurse Communication: Mobility status;Other (comment)(RN present during session and aware of patient status/mobility ) Misty [name] Visit Diagnosis: Unsteadiness on feet (R26.81);Pain;Difficulty in walking, not elsewhere classified (R26.2) Pain - Right/Left: Right Pain - part of body: Shoulder;Hip     Time: [id number] Misty [name] Time Calculation (min) (ACUTE ONLY): 49 min  Charges:  $Therapeutic Activity: 8-22 mins                    G Codes:       Misty Misty [name], Misty Misty [name], Misty Misty [name]  Supplemental Physical Therapist [place]

## 2017-07-13 NOTE — Progress Notes (Signed)
Per charge nurse, primary team returned call.  See new order for CXR.  AKing RNBSN

## 2017-07-13 NOTE — Progress Notes (Signed)
Occupational Therapy Treatment Patient Details Name: Misty [name] MRN: [id number] DOB: 05/14/1935 Today's Date: 07/13/2017    History of present illness Misty [name] a 82 y.o.femalepresenting with AMS. PMH is significant for HTN, DM, HLD, treated hep c, gout, chronic renal insufficiency s/p renal transplant.Pt was found to have UTI.   OT comments  Pt current in bed and reports some incr comfort compared to chair but continues to c/o difficulty breathing. Pt will require total +2 (A) for all transfers. MD please clarify activity tolerance after pending chest xray results.   Follow Up Recommendations  SNF    Equipment Recommendations  Other (comment)(TBA)    Recommendations for Other Services      Precautions / Restrictions Precautions Precautions: Fall Restrictions Weight Bearing Restrictions: Yes RUE Weight Bearing: Non weight bearing RLE Weight Bearing: Non weight bearing       Mobility Bed Mobility Overal bed mobility: Needs Assistance Bed Mobility: Rolling;Supine to Sit Rolling: +2 for physical assistance;Total assist   Supine to sit: +2 for physical assistance;Max assist;HOB elevated     General bed mobility comments: total +2 [facility] Pt total +3 anterior scoot into bed and then total +3 to pivot on buttock with head and legs support to bed supine position  Transfers Overall transfer level: Needs assistance Equipment used: 2 person hand held assist Transfers: Anterior-Posterior Transfer Sit to Stand: +2 physical assistance;Max assist Stand pivot transfers: +2 physical assistance;Max assist   Anterior-Posterior transfers: From elevated surface(+3 PT / OT and tech to transfer back into the bed)   General transfer comment: pt continued c/o chest pain and being chest xray ordered. Transfering patient total (A) back into the bed    Balance Overall balance assessment: Needs assistance Sitting-balance support: Bilateral upper extremity supported;Feet  supported Sitting balance-Leahy Scale: Poor     Standing balance support: Bilateral upper extremity supported;During functional activity Standing balance-Leahy Scale: Zero                             ADL either performed or assessed with clinical judgement   ADL Overall ADL's : Needs assistance/impaired Eating/Feeding: Minimal assistance                                     General ADL Comments: pt transfered from chair to bed with anterior posterior transfer total +3 (A)      Vision   Vision Assessment?: No apparent visual deficits   Perception     Praxis      Cognition Arousal/Alertness: Awake/alert Behavior During Therapy: Impulsive;Anxious;Restless Overall Cognitive Status: No family/caregiver present to determine baseline cognitive functioning                                 General Comments: pt states "i can't breath" "are you going to help me?" "call the doctor" pt educated that she is returning to supine in the bed for chest xray        Exercises     Shoulder Instructions       General Comments pt sitting BP 155/124 HR 136. pt with decr oxygen saturation 83 % on 4 L HR 122. pt with prolonged sitting 172/82 with c/o chest pain and breathing with incr Oxygen to 6L. pt with very cold hands so heat pack placed on  L hand to help warm finger tips with close monitoring of vitals. Pt after > 10 minutes BP 155/76 pt placed on non rebreather 4L 97% oxygen     Pertinent Vitals/ Pain       Pain Assessment: Faces Faces Pain Scale: Hurts whole lot Pain Location: R leg, R shoulder, all over  Pain Descriptors / Indicators: Operative site guarding;Aching;Discomfort;Moaning Pain Intervention(s): Monitored during session;Premedicated before session;Repositioned  Home Living Family/patient expects to be discharged to:: Skilled nursing facility Living Arrangements: Children;Other relatives                                       Prior Functioning/Environment Level of Independence: Independent with assistive device(s)            Frequency  Min 2X/week        Progress Toward Goals  OT Goals(current goals can now be found in the care plan section)  Progress towards OT goals: Progressing toward goals  Acute Rehab OT Goals Patient Stated Goal: to get back in the bed OT Goal Formulation: With patient Time For Goal Achievement: 07/27/17 Potential to Achieve Goals: Good ADL Goals Pt Will Perform Grooming: with supervision;sitting Pt Will Perform Upper Body Bathing: with supervision;sitting Pt Will Transfer to Toilet: squat pivot transfer;with min assist;with +2 assist;bedside commode Additional ADL Goal #1: pt will complete bed mobility supervision level as precursor to adls.  Plan Discharge plan remains appropriate    Co-evaluation    PT/OT/SLP Co-Evaluation/Treatment: Yes Reason for Co-Treatment: Necessary to address cognition/behavior during functional activity;For patient/therapist safety;To address functional/ADL transfers   OT goals addressed during session: ADL's and self-care;Strengthening/ROM      AM-PAC PT "6 Clicks" Daily Activity     Outcome Measure   Help from another person eating meals?: A Little Help from another person taking care of personal grooming?: A Lot Help from another person toileting, which includes using toliet, bedpan, or urinal?: A Lot Help from another person bathing (including washing, rinsing, drying)?: A Lot Help from another person to put on and taking off regular upper body clothing?: A Lot Help from another person to put on and taking off regular lower body clothing?: A Lot 6 Click Score: 13    End of Session Equipment Utilized During Treatment: Gait belt;Rolling walker;Oxygen  OT Visit Diagnosis: Unsteadiness on feet (R26.81);Pain;Muscle weakness (generalized) (M62.81) Pain - Right/Left: Right Pain - part of body: Shoulder   Activity Tolerance  Patient limited by fatigue;Patient limited by pain   Patient Left in chair;with call bell/phone within reach;with nursing/sitter in room;with family/visitor present   Nurse Communication Mobility status;Precautions        Time: [id number] OT Time Calculation (min): 30 min  Charges: OT General Charges $OT Visit: 1 Visit OT Evaluation $OT Eval High Complexity: 1 High OT Treatments $Therapeutic Activity: 8-22 mins   Misty [name], Misty [name]   OTR/L Pager: [phone] Office: [phone] .    Misty [name], Misty [name] 07/13/2017, 3:58 PM

## 2017-07-14 LAB — BASIC METABOLIC PANEL
Anion gap: 6 (ref 5–15)
BUN: 13 mg/dL (ref 6–20)
CHLORIDE: 102 mmol/L (ref 101–111)
CO2: 28 mmol/L (ref 22–32)
CREATININE: 0.42 mg/dL — AB (ref 0.44–1.00)
Calcium: 8.1 mg/dL — ABNORMAL LOW (ref 8.9–10.3)
GFR calc non Af Amer: >60 mL/min (ref >60)
GLUCOSE: 96 mg/dL (ref 65–99)
Potassium: 4 mmol/L (ref 3.5–5.1)
Sodium: 136 mmol/L (ref 135–145)

## 2017-07-14 LAB — CBC
HEMATOCRIT: 29.9 % — AB (ref 36.0–46.0)
HEMOGLOBIN: 9.8 g/dL — AB (ref 12.0–15.0)
MCH: 30.3 pg (ref 26.0–34.0)
MCHC: 32.8 g/dL (ref 30.0–36.0)
MCV: 92.6 fL (ref 78.0–100.0)
Platelets: 300 10*3/uL (ref 150–400)
RBC: 3.23 MIL/uL — ABNORMAL LOW (ref 3.87–5.11)
RDW: 14.1 % (ref 11.5–15.5)
WBC: 10.3 10*3/uL (ref 4.0–10.5)

## 2017-07-14 MED ORDER — IPRATROPIUM-ALBUTEROL 0.5-2.5 (3) MG/3ML IN SOLN
3.0000 mL | Freq: Three times a day (TID) | RESPIRATORY_TRACT | Status: DC
  Administered 2017-07-14 – 2017-07-16 (×6): 3 mL via RESPIRATORY_TRACT
  Filled 2017-07-14 (×6): qty 3

## 2017-07-14 NOTE — Progress Notes (Signed)
3 Days Post-Op   Subjective/Chief Complaint: Pt with pain to R shoulder CTA=R sided PE on elliquis   Objective: Vital signs in last 24 hours: Temp:  [97.4 F (36.3 C)-98.1 F (36.7 C)] 97.6 F (36.4 C) (12/22 0400) Pulse Rate:  [71-94] 73 (12/22 0600) Resp:  [9-21] 16 (12/22 0600) BP: (92-138)/(56-71) 118/66 (12/22 0600) SpO2:  [92 %-100 %] 100 % (12/22 0600) Last BM Date: 07/09/17  Intake/Output from previous day: 12/21 0701 - 12/22 0700 In: 2270 [P.O.:340; I.V.:1930] Out: 1500 [Urine:1500] Intake/Output this shift: No intake/output data recorded.  Constitutional: No acute distress, conversant, appears states age. Eyes: Anicteric sclerae, moist conjunctiva, no lid lag Lungs: Clear to auscultation bilaterally, normal respiratory effort CV: regular rate and rhythm, no murmurs, no peripheral edema, pedal pulses 2+ GI: Soft, no masses or hepatosplenomegaly, non-tender to palpation Skin: No rashes, palpation reveals normal turgor Ext: R shoulder sling, min ROM Psychiatric: appropriate judgment and insight, oriented to person, place, and time   Lab Results:  Recent Labs    07/13/17 0358 07/14/17 0516  WBC 10.3 10.3  HGB 10.4* 9.8*  HCT 32.1* 29.9*  PLT 283 300   BMET Recent Labs    07/13/17 0358 07/14/17 0516  NA 135 136  K 3.8 4.0  CL 100* 102  CO2 28 28  GLUCOSE 92 96  BUN 15 13  CREATININE 0.42* 0.42*  CALCIUM 8.1* 8.1*   PT/INR No results for input(s): LABPROT, INR in the last 72 hours. ABG No results for input(s): PHART, HCO3 in the last 72 hours.  Invalid input(s): PCO2, PO2  Studies/Results: Ct Head Wo Contrast  Result Date: 07/13/2017 CLINICAL DATA:  Continued surveillance closed head injury. EXAM: CT HEAD WITHOUT CONTRAST TECHNIQUE: Contiguous axial images were obtained from the base of the skull through the vertex without intravenous contrast. COMPARISON:  07/09/2017. FINDINGS: Brain: The previously described acute RIGHT subdural has  resolved, 2 mm previously. No new extra-axial collections. Layering intraventricular blood appears mildly increased from priors, but not dramatically so. There is more hemorrhage in the dependent RIGHT and LEFT occipital horns. Ventriculomegaly, without proportionate cortical atrophy, raising the question of normal pressure hydrocephalus. No parenchymal hemorrhage. Hypoattenuation of white matter, uncertain significance. Vascular: Calcification of the cavernous internal carotid arteries consistent with cerebrovascular atherosclerotic disease. No signs of intracranial large vessel occlusion. Skull: No skull fracture. Sinuses/Orbits: Clear sinuses. Other: None. IMPRESSION: RIGHT subdural hematoma has resolved. Layering intraventricular blood appears modestly increased from priors, greater on the RIGHT. Continued surveillance warranted. Ventriculomegaly without proportion cortical atrophy, query normal pressure hydrocephalus. Electronically Signed   By: Elsie StainJohn T Curnes M.D.   On: 07/13/2017 16:10   Ct Angio Chest Pe W Or Wo Contrast  Result Date: 07/13/2017 CLINICAL DATA:  Shortness of breath. EXAM: CT ANGIOGRAPHY CHEST WITH CONTRAST TECHNIQUE: Multidetector CT imaging of the chest was performed using the standard protocol during bolus administration of intravenous contrast. Multiplanar CT image reconstructions and MIPs were obtained to evaluate the vascular anatomy. CONTRAST:  100mL ISOVUE-370 IOPAMIDOL (ISOVUE-370) INJECTION 76% COMPARISON:  Current chest radiograph.  Chest CT, 07/09/2017. FINDINGS: Cardiovascular: There is satisfactory opacification of the pulmonary arteries to the segmental level. There are right-sided pulmonary emboli. A saddle embolus bridges the distal right pulmonary artery extending into the right lower lobe and middle lobe segmental branches. There is a small nonocclusive pulmonary embolus in a right upper lobe segmental branch. There are no left-sided pulmonary emboli. The bulk of the  pulmonary emboli are in the right lower lobe.  RV/LV ratio is just under 1.  No convincing right heart strain. Heart is top-normal in size. There are moderate coronary artery calcifications. Aortic atherosclerotic calcifications are noted. There is mild enlargement of the pulmonary arteries, main measuring 3.6 cm in right measuring 2.9 cm, left measuring 2.5 cm. Mediastinum/Nodes: No neck base, axillary, mediastinal or hilar masses or enlarged lymph nodes. Trachea is patent. Esophagus is unremarkable. Lungs/Pleura: Small bilateral pleural effusions. There is dependent opacity in the lungs consistent with atelectasis. This is most significant in the left lower lobe, which is mostly collapsed. No convincing pneumonia. No pulmonary edema. No pneumothorax. Upper Abdomen: No acute findings. Musculoskeletal: Displaced right proximal humeral fracture. Multiple thoracic compression fractures with some loss of vertebral height at all thoracic levels with the exception of T10. No severe fractures are at T2, T6 the T7, T8 and T9. These are all stable from the prior CT scan. The proximal humeral fracture is also unchanged from the prior CT scan. No osteoblastic or osteolytic lesions. Review of the MIP images confirms the above findings. IMPRESSION: 1. Right-sided pulmonary emboli as described. 2. Mild enlargement of the main and right pulmonary arteries suggesting pulmonary hypertension. 3. Small bilateral pleural effusions. 4. Significant dependent atelectasis most evident in the left lower lobe which is mostly collapsed. No convincing pneumonia. No pulmonary edema. 5. Proximal right humerus and multiple vertebral fractures as described without change from the prior CT. Critical Value/emergent results were called by telephone at the time of interpretation on 07/13/2017 at 2:53 pm to the patient's nurse in ICU, Maralyn SagoSarah, who verbally acknowledged these results and will be alerting the patient's attending physicians. Aortic  Atherosclerosis (ICD10-I70.0). Electronically Signed   By: Amie Portlandavid  Ormond M.D.   On: 07/13/2017 14:57   Dg Chest Port 1 View  Result Date: 07/13/2017 CLINICAL DATA:  Shortness of breath. EXAM: PORTABLE CHEST 1 VIEW COMPARISON:  07/09/2017. FINDINGS: LEFT lower lobe atelectasis has developed, dense retrocardiac region. Superimposed consolidation not excluded. No pneumothorax. Unchanged cardiomediastinal silhouette. Unchanged RIGHT humerus fracture. IMPRESSION: Dense LEFT lower lobe atelectasis has developed since priors. Superimposed consolidation not excluded on this portable semi erect radiograph. No pneumothorax. Electronically Signed   By: Elsie StainJohn T Curnes M.D.   On: 07/13/2017 12:00    Anti-infectives: Anti-infectives (From admission, onward)   Start     Dose/Rate Route Frequency Ordered Stop   07/11/17 2200  ceFAZolin (ANCEF) IVPB 1 g/50 mL premix     1 g 100 mL/hr over 30 Minutes Intravenous Every 8 hours 07/11/17 1828 07/12/17 0531      Assessment/Plan: HTN - continue home med norvasc 5mg , metoprolol 25mg  BID and hydralazine PRN - HR and BP better on metoprolol Osteoporosis Chronic pain - takes tramadol at home  Fall PE - On Elliquis after d/w NSR SDH/Concussion - CTH stable, started on Elliquis R frontal scalp hematoma - ice prn R humeral neck fx - non-op management per Dr. Ophelia CharterYates, sling R intertrochanteric femur fracture - ORIF today by Dr. Ophelia CharterYates Constipation - continue miralax/colace, enema PRN ABL anemia - has stabilized, CBC in AM  ID - none FEN - IVF, NPO for OR VTE - SCD       LOS: 5 days    Marigene Ehlersamirez Jr., [facility] 07/14/2017

## 2017-07-15 NOTE — Progress Notes (Signed)
Patient ID: Misty [name], female   DOB: 05/14/1935, 82 y.o.   MRN: [id number] 4 Days Post-Op  Subjective: Pain control fair, C/O feeling anxious  Objective: Vital signs in last 24 hours: Temp:  [97.6 F (36.4 C)-98.7 F (37.1 C)] 97.7 F (36.5 C) (12/23 0409) Pulse Rate:  [63-93] 67 (12/23 0700) Resp:  [8-25] 14 (12/23 0700) BP: (97-155)/(54-82) 128/61 (12/23 0700) SpO2:  [94 %-100 %] 100 % (12/23 0700) Last BM Date: 07/09/17  Intake/Output from previous day: 12/22 0701 - 12/23 0700 In: 1875 [I.V.:1875] Out: 1600 [Urine:1600] Intake/Output this shift: No intake/output data recorded.  General appearance: cooperative Resp: clear to auscultation bilaterally Cardio: regular rate and rhythm GI: soft, NT, ND Extremities: R hip dressing, calves soft  Lab Results: CBC  Recent Labs    07/13/17 0358 07/14/17 0516  WBC 10.3 10.3  HGB 10.4* 9.8*  HCT 32.1* 29.9*  PLT 283 300   BMET Recent Labs    07/13/17 0358 07/14/17 0516  NA 135 136  K 3.8 4.0  CL 100* 102  CO2 28 28  GLUCOSE 92 96  BUN 15 13  CREATININE 0.42* 0.42*  CALCIUM 8.1* 8.1*   PT/INR No results for input(s): LABPROT, INR in the last 72 hours. ABG No results for input(s): PHART, HCO3 in the last 72 hours.  Invalid input(s): PCO2, PO2  Studies/Results: Ct Head Wo Contrast  Result Date: 07/13/2017 CLINICAL DATA:  Continued surveillance closed head injury. EXAM: CT HEAD WITHOUT CONTRAST TECHNIQUE: Contiguous axial images were obtained from the base of the skull through the vertex without intravenous contrast. COMPARISON:  07/09/2017. FINDINGS: Brain: The previously described acute RIGHT subdural has resolved, 2 mm previously. No new extra-axial collections. Layering intraventricular blood appears mildly increased from priors, but not dramatically so. There is more hemorrhage in the dependent RIGHT and LEFT occipital horns. Ventriculomegaly, without proportionate cortical atrophy, raising the question of  normal pressure hydrocephalus. No parenchymal hemorrhage. Hypoattenuation of white matter, uncertain significance. Vascular: Calcification of the cavernous internal carotid arteries consistent with cerebrovascular atherosclerotic disease. No signs of intracranial large vessel occlusion. Skull: No skull fracture. Sinuses/Orbits: Clear sinuses. Other: None. IMPRESSION: RIGHT subdural hematoma has resolved. Layering intraventricular blood appears modestly increased from priors, greater on the RIGHT. Continued surveillance warranted. Ventriculomegaly without proportion cortical atrophy, query normal pressure hydrocephalus. Electronically Signed   By: Elsie StainJohn T Curnes M.D.   On: 07/13/2017 16:10   Ct Angio Chest Pe W Or Wo Contrast  Result Date: 07/13/2017 CLINICAL DATA:  Shortness of breath. EXAM: CT ANGIOGRAPHY CHEST WITH CONTRAST TECHNIQUE: Multidetector CT imaging of the chest was performed using the standard protocol during bolus administration of intravenous contrast. Multiplanar CT image reconstructions and MIPs were obtained to evaluate the vascular anatomy. CONTRAST:  100mL ISOVUE-370 IOPAMIDOL (ISOVUE-370) INJECTION 76% COMPARISON:  Current chest radiograph.  Chest CT, 07/09/2017. FINDINGS: Cardiovascular: There is satisfactory opacification of the pulmonary arteries to the segmental level. There are right-sided pulmonary emboli. A saddle embolus bridges the distal right pulmonary artery extending into the right lower lobe and middle lobe segmental branches. There is a small nonocclusive pulmonary embolus in a right upper lobe segmental branch. There are no left-sided pulmonary emboli. The bulk of the pulmonary emboli are in the right lower lobe. RV/LV ratio is just under 1.  No convincing right heart strain. Heart is top-normal in size. There are moderate coronary artery calcifications. Aortic atherosclerotic calcifications are noted. There is mild enlargement of the pulmonary arteries, main measuring 3.6 cm  in  right measuring 2.9 cm, left measuring 2.5 cm. Mediastinum/Nodes: No neck base, axillary, mediastinal or hilar masses or enlarged lymph nodes. Trachea is patent. Esophagus is unremarkable. Lungs/Pleura: Small bilateral pleural effusions. There is dependent opacity in the lungs consistent with atelectasis. This is most significant in the left lower lobe, which is mostly collapsed. No convincing pneumonia. No pulmonary edema. No pneumothorax. Upper Abdomen: No acute findings. Musculoskeletal: Displaced right proximal humeral fracture. Multiple thoracic compression fractures with some loss of vertebral height at all thoracic levels with the exception of T10. No severe fractures are at T2, T6 the T7, T8 and T9. These are all stable from the prior CT scan. The proximal humeral fracture is also unchanged from the prior CT scan. No osteoblastic or osteolytic lesions. Review of the MIP images confirms the above findings. IMPRESSION: 1. Right-sided pulmonary emboli as described. 2. Mild enlargement of the main and right pulmonary arteries suggesting pulmonary hypertension. 3. Small bilateral pleural effusions. 4. Significant dependent atelectasis most evident in the left lower lobe which is mostly collapsed. No convincing pneumonia. No pulmonary edema. 5. Proximal right humerus and multiple vertebral fractures as described without change from the prior CT. Critical Value/emergent results were called by telephone at the time of interpretation on 07/13/2017 at 2:53 pm to the patient's nurse in ICU, Maralyn SagoSarah, who verbally acknowledged these results and will be alerting the patient's attending physicians. Aortic Atherosclerosis (ICD10-I70.0). Electronically Signed   By: Amie Portlandavid  Ormond M.D.   On: 07/13/2017 14:57   Dg Chest Port 1 View  Result Date: 07/13/2017 CLINICAL DATA:  Shortness of breath. EXAM: PORTABLE CHEST 1 VIEW COMPARISON:  07/09/2017. FINDINGS: LEFT lower lobe atelectasis has developed, dense retrocardiac  region. Superimposed consolidation not excluded. No pneumothorax. Unchanged cardiomediastinal silhouette. Unchanged RIGHT humerus fracture. IMPRESSION: Dense LEFT lower lobe atelectasis has developed since priors. Superimposed consolidation not excluded on this portable semi erect radiograph. No pneumothorax. Electronically Signed   By: Elsie StainJohn T Curnes M.D.   On: 07/13/2017 12:00    Anti-infectives: Anti-infectives (From admission, onward)   Start     Dose/Rate Route Frequency Ordered Stop   07/11/17 2200  ceFAZolin (ANCEF) IVPB 1 g/50 mL premix     1 g 100 mL/hr over 30 Minutes Intravenous Every 8 hours 07/11/17 1828 07/12/17 0531      Assessment/Plan: HTN - continue home med norvasc 5mg , metoprolol 25mg  BID and hydralazine PRN - HR and BP better on metoprolol Osteoporosis Chronic pain - takes tramadol at home  Fall PE - On Elliquis after d/w NSR SDH/Concussion - CTH stable, started on Elliquis R frontal scalp hematoma - ice prn R humeral neck fx - non-op management per Dr. Ophelia CharterYates, sling R intertrochanteric femur fracture - ORIF by Dr. Ophelia CharterYates Constipation - continue miralax/colace, enema PRN ABL anemia - has stabilized, CBC in AM  ID - none FEN - IVF, NPO for OR VTE - SCD Dispo - to 4NP, PT/OT  LOS: 6 days    Violeta GelinasBurke Aminah Zabawa, MD, MPH, FACS Trauma: [phone] General Surgery: [phone]  07/15/2017

## 2017-07-16 ENCOUNTER — Encounter (HOSPITAL_COMMUNITY): Payer: Self-pay

## 2017-07-16 LAB — CBC
HEMATOCRIT: 32.7 % — AB (ref 36.0–46.0)
Hemoglobin: 10.5 g/dL — ABNORMAL LOW (ref 12.0–15.0)
MCH: 29.8 pg (ref 26.0–34.0)
MCHC: 32.1 g/dL (ref 30.0–36.0)
MCV: 92.9 fL (ref 78.0–100.0)
PLATELETS: 383 10*3/uL (ref 150–400)
RBC: 3.52 MIL/uL — AB (ref 3.87–5.11)
RDW: 14 % (ref 11.5–15.5)
WBC: 8.8 10*3/uL (ref 4.0–10.5)

## 2017-07-16 LAB — GLUCOSE, CAPILLARY: Glucose-Capillary: 104 mg/dL — ABNORMAL HIGH (ref 65–99)

## 2017-07-16 MED ORDER — IPRATROPIUM-ALBUTEROL 0.5-2.5 (3) MG/3ML IN SOLN: 3.0000 mL | Freq: Four times a day (QID) | RESPIRATORY_TRACT | Status: DC | PRN

## 2017-07-16 NOTE — Progress Notes (Signed)
Patient very anxious, stating "there's all those people at my door." RN provided reassurance and continually reorienting patient location. Room change (to available room on unit) requested by RN to decrease patient anxiety. RN will continue to monitor patient.

## 2017-07-16 NOTE — Progress Notes (Signed)
Physical Therapy Treatment Patient Details Name: Misty [name] MRN: [id number] DOB: 02/13/1935 Today's Date: 07/16/2017    History of Present Illness Misty LovelyGloria J Pooleis a 82 y.o.femalepresenting with AMS. PMH is significant for HTN, DM, HLD, treated hep c, gout, chronic renal insufficiency s/p renal transplant.Pt was found to have UTI.    PT Comments    Pt is anxious about falling, and dizzy with motion (suspected central vs peripheral vertigo given some nystagmus visualized with bed mobility and transfers).   She was able to get OOB to chair with two person assist, but was unable to physically help much.  HEP exercises preformed in the bed prior to mobility and pt pre medicated with pain meds.  PT will continue to follow acutely and she remains appropriate for SNF level rehab at discharge.   Follow Up Recommendations  SNF;Supervision/Assistance - 24 hour     Equipment Recommendations  Wheelchair (measurements PT);Wheelchair cushion (measurements PT);3in1 (PT);Hospital bed    Recommendations for Other Services   NA     Precautions / Restrictions Precautions Precautions: Fall Required Braces or Orthoses: Sling(right arm) Restrictions RUE Weight Bearing: Non weight bearing RLE Weight Bearing: Weight bearing as tolerated    Mobility  Bed Mobility Overal bed mobility: Needs Assistance Bed Mobility: Supine to Sit     Supine to sit: +2 for physical assistance;Max assist;HOB elevated     General bed mobility comments: Two person max assist to get pt to move to sitting EOB.  PT needed assist at both legs and trunk. She was able to grab to the left sided bed rail wiht her left hand to help a little.   Transfers Overall transfer level: Needs assistance Equipment used: None Transfers: Sit to/from [employer] CorporationStand;Squat Pivot Transfers Sit to Stand: +2 physical assistance;Max assist;From elevated surface   Squat pivot transfers: +2 physical assistance;From elevated surface;Max assist      General transfer comment: Two person max assist to squat pivot transfer to the recliner chair on pt's left side.  Pt supported by belt and bed pad under her hips.  She was able to use her left arm to hold to the therapist.           Balance Overall balance assessment: Needs assistance Sitting-balance support: Feet supported;Single extremity supported Sitting balance-Leahy Scale: Poor Sitting balance - Comments: Min to mod assist EOB.  When we came to sitting pt reported dizziness that made her feel like she was falling forward.  She may have some central/peripherial vertigo that may need to be assessed at a later date when she can tolerate the treatments.    Standing balance support: Single extremity supported Standing balance-Leahy Scale: Zero Standing balance comment: two person max assist.                             Cognition Arousal/Alertness: Awake/alert Behavior During Therapy: Anxious Overall Cognitive Status: No family/caregiver present to determine baseline cognitive functioning                                 General Comments: Not specifically tested, but she is very anxious.      Exercises Total Joint Exercises Ankle Circles/Pumps: AROM;Both;20 reps Quad Sets: AROM;Right;10 reps Heel Slides: AAROM;Right;10 reps Hip ABduction/ADduction: AAROM;Right;10 reps        Pertinent Vitals/Pain Pain Assessment: Faces Faces Pain Scale: Hurts whole lot Pain Location: right arm and leg  Pain Descriptors / Indicators: Grimacing;Guarding Pain Intervention(s): Limited activity within patient's tolerance;Monitored during session;Repositioned;Ice applied(ice applied to right leg)       Prior Function            PT Goals (current goals can now be found in the care plan section) Acute Rehab PT Goals Patient Stated Goal: to not hurt and not fall Progress towards PT goals: Progressing toward goals    Frequency    Min 3X/week      PT Plan  Current plan remains appropriate       AM-PAC PT "6 Clicks" Daily Activity  Outcome Measure  Difficulty turning over in bed (including adjusting bedclothes, sheets and blankets)?: Unable Difficulty moving from lying on back to sitting on the side of the bed? : Unable Difficulty sitting down on and standing up from a chair with arms (e.g., wheelchair, bedside commode, etc,.)?: Unable Help needed moving to and from a bed to chair (including a wheelchair)?: Total Help needed walking in hospital room?: Total Help needed climbing 3-5 steps with a railing? : Total 6 Click Score: 6    End of Session Equipment Utilized During Treatment: Gait belt;Oxygen;Other (comment)(R shoulder sling) Activity Tolerance: Patient limited by pain;Other (comment)(anxiety, nausea, dizziness) Patient left: in chair;with call bell/phone within reach Nurse Communication: Mobility status;Weight bearing status PT Visit Diagnosis: Unsteadiness on feet (R26.81);Pain;Difficulty in walking, not elsewhere classified (R26.2) Pain - Right/Left: Right Pain - part of body: Shoulder;Hip     Time: [id number] PT Time Calculation (min) (ACUTE ONLY): 24 min  Charges:  $Therapeutic Exercise: 8-22 mins $Therapeutic Activity: 8-22 mins          Prapti Grussing B. Amyre Segundo, PT, DPT [phone]           07/16/2017, 1:06 PM

## 2017-07-16 NOTE — Progress Notes (Signed)
  Progress Note: General Surgery Service   Assessment/Plan: Patient Active Problem List   Diagnosis Date Noted  . Malnutrition of moderate degree 07/13/2017  . Fall from standing 07/09/2017   s/p Procedure(s): RIGHT TROCHANTERIC NAIL 07/11/2017  HTN - continue home med norvasc 5mg , metoprolol 25mg  BID and hydralazine PRN- HR and BP better on metoprolol Osteoporosis Chronic pain - takes tramadol at home  Fall PE - On Elliquis after d/w NSR SDH/Concussion - CTH stable, started on Elliquis R frontal scalp hematoma - ice prn R humeral neck fx -non-op management per Dr. Ophelia CharterYates, sling R intertrochanteric femur fracture -ORIF by Dr. Ophelia CharterYates Constipation - continue miralax/colace, enema PRN ABL anemia- has stabilized, CBC in AM  ID - none FEN - IVF, continue diet VTE - SCD Dispo - progressive care, PT/OT  LOS: 6 days      LOS: 7 days  Chief Complaint/Subjective: Nauseated today, right shoulder pain  Objective: Vital signs in last 24 hours: Temp:  [97.2 F (36.2 C)-98.6 F (37 C)] 98.4 F (36.9 C) (12/24 0340) Pulse Rate:  [63-100] 84 (12/24 0907) Resp:  [14-26] 22 (12/24 0900) BP: (115-163)/(58-79) 146/75 (12/24 0906) SpO2:  [89 %-100 %] 99 % (12/24 0900) Last BM Date: 07/14/17(per pt)  Intake/Output from previous day: 12/23 0701 - 12/24 0700 In: 1505 [P.O.:230; I.V.:1275] Out: 1400 [Urine:1400] Intake/Output this shift: Total I/O In: 775 [P.O.:100; I.V.:675] Out: 300 [Urine:300]  Lungs: CTAB  Cardiovascular: RRR  Abd: soft, NT, ND  Extremities: RUE in sling  Neuro: GCS 15  Lab Results: CBC  Recent Labs    07/14/17 0516 07/16/17 0347  WBC 10.3 8.8  HGB 9.8* 10.5*  HCT 29.9* 32.7*  PLT 300 383   BMET Recent Labs    07/14/17 0516  NA 136  K 4.0  CL 102  CO2 28  GLUCOSE 96  BUN 13  CREATININE 0.42*  CALCIUM 8.1*   PT/INR No results for input(s): LABPROT, INR in the last 72 hours. ABG No results for input(s): PHART, HCO3 in the last  72 hours.  Invalid input(s): PCO2, PO2  Studies/Results:  Anti-infectives: Anti-infectives (From admission, onward)   Start     Dose/Rate Route Frequency Ordered Stop   07/11/17 2200  ceFAZolin (ANCEF) IVPB 1 g/50 mL premix     1 g 100 mL/hr over 30 Minutes Intravenous Every 8 hours 07/11/17 1828 07/12/17 0531      Medications: Scheduled Meds: . acetaminophen  1,000 mg Oral TID  . amLODipine  7.5 mg Oral Daily  . apixaban  10 mg Oral BID  . docusate sodium  200 mg Oral BID  . feeding supplement  1 Container Oral TID BM  . feeding supplement (PRO-STAT SUGAR FREE 64)  30 mL Oral BID  . magnesium hydroxide  30 mL Oral Once  . methocarbamol  500 mg Oral TID  . metoprolol tartrate  25 mg Oral BID  . polyethylene glycol  17 g Oral Daily  . sodium phosphate  1 enema Rectal Once  . traMADol  50 mg Oral BID   Continuous Infusions: . lactated ringers 75 mL/hr at 07/16/17 0900  . lactated ringers 10 mL/hr at 07/13/17 1900   PRN Meds:.acetaminophen **OR** acetaminophen, hydrALAZINE, HYDROmorphone (DILAUDID) injection, ipratropium-albuterol, menthol-cetylpyridinium **OR** phenol, ondansetron **OR** ondansetron (ZOFRAN) IV, ondansetron **OR** ondansetron (ZOFRAN) IV, oxyCODONE  Rodman PickleLuke Aaron Edu On, MD Pg# [phone] [facility]

## 2017-07-16 NOTE — Discharge Instructions (Signed)
Information on my medicine - ELIQUIS (apixaban)  Why was Eliquis prescribed for you? Eliquis was prescribed to treat blood clots that may have been found in the veins of your legs (deep vein thrombosis) or in your lungs (pulmonary embolism) and to reduce the risk of them occurring again.  What do You need to know about Eliquis ? The starting dose is 10 mg (two 5 mg tablets) taken TWICE daily for the FIRST SEVEN (7) DAYS, then on the evening of 12/28 the dose is reduced to ONE 5 mg tablet taken TWICE daily.  Eliquis may be taken with or without food.   Try to take the dose about the same time in the morning and in the evening. If you have difficulty swallowing the tablet whole please discuss with your pharmacist how to take the medication safely.  Take Eliquis exactly as prescribed and DO NOT stop taking Eliquis without talking to the doctor who prescribed the medication.  Stopping may increase your risk of developing a new blood clot.  Refill your prescription before you run out.  After discharge, you should have regular check-up appointments with your healthcare provider that is prescribing your Eliquis.    What do you do if you miss a dose? If a dose of ELIQUIS is not taken at the scheduled time, take it as soon as possible on the same day and twice-daily administration should be resumed. The dose should not be doubled to make up for a missed dose.  Important Safety Information A possible side effect of Eliquis is bleeding. You should call your healthcare provider right away if you experience any of the following: ? Bleeding from an injury or your nose that does not stop. ? Unusual colored urine (red or dark brown) or unusual colored stools (red or black). ? Unusual bruising for unknown reasons. ? A serious fall or if you hit your head (even if there is no bleeding).  Some medicines may interact with Eliquis and might increase your risk of bleeding or clotting while on Eliquis.  To help avoid this, consult your healthcare provider or pharmacist prior to using any new prescription or non-prescription medications, including herbals, vitamins, non-steroidal anti-inflammatory drugs (NSAIDs) and supplements.  This website has more information on Eliquis (apixaban): http://www.eliquis.com/eliquis/home

## 2017-07-16 NOTE — Care Management Note (Signed)
Case Management Note  Patient Details  Name: Bernette RedbirdJoan Adamec MRN: [id number] Date of Birth: 03/17/1935  Subjective/Objective:   Pt admitted on 07/09/17 s/p ground level fall with acute RT SDH without midline shift, small amount of intraventricular blood, RT frontal scalp hematoma, RT humeral neck fx, and RT intertrochanteric RT femur fx.  PTA, pt resided at home with her son.                   Action/Plan: Ortho repairs planned for 07/11/17 afternoon.  PT/OT to follow post op.  Will follow for discharge planning as pt progresses.    Expected Discharge Date:                  Expected Discharge Plan:  Skilled Nursing Facility  In-House Referral:  Clinical Social Work  Discharge planning Services  CM Consult  Post Acute Care Choice:    Choice offered to:     DME Arranged:    DME Agency:     HH Arranged:    HH Agency:     Status of Service:  In process, will continue to follow  If discussed at Long Length of Stay Meetings, dates discussed:    Additional Comments:  07/16/17 J. Emarie Paul, RN, BSN PT/OT recommending SNF for rehab; CSW consulted to facilitate dc to SNF upon medical stability.    Quintella BatonJulie W. Obediah Welles, RN, BSN  Trauma/Neuro ICU Case Manager [phone]

## 2017-07-16 NOTE — Social Work (Addendum)
CSW met with pt at bedside. Pt pleasant, stated she is in pain.   SBIRT completed and offers left at pt bedside. No number to reach son on chart, patient unable to give.  CSW continuing to follow to provide support for discharge when medically appropriate.   Alexander Mt, [place] Work [phone]

## 2017-07-17 ENCOUNTER — Inpatient Hospital Stay (HOSPITAL_COMMUNITY): Payer: Medicare Other

## 2017-07-17 LAB — GLUCOSE, CAPILLARY
GLUCOSE-CAPILLARY: 93 mg/dL (ref 65–99)
Glucose-Capillary: 82 mg/dL (ref 65–99)
Glucose-Capillary: 107 mg/dL — ABNORMAL HIGH (ref 65–99)

## 2017-07-17 MED ORDER — GABAPENTIN 100 MG PO CAPS
100.0000 mg | ORAL_CAPSULE | Freq: Two times a day (BID) | ORAL | Status: DC
  Administered 2017-07-17 – 2017-07-19 (×5): 100 mg via ORAL
  Filled 2017-07-17 (×5): qty 1

## 2017-07-17 MED ORDER — NYSTATIN 100000 UNIT/ML MT SUSP
5.0000 mL | Freq: Four times a day (QID) | OROMUCOSAL | Status: DC
  Administered 2017-07-17 – 2017-07-19 (×10): 500000 [IU] via ORAL
  Filled 2017-07-17 (×9): qty 5

## 2017-07-17 NOTE — Progress Notes (Signed)
6 Days Post-Op   Subjective/Chief Complaint: Complains of chest pain this morning Remains hemodynamically stable   Objective: Vital signs in last 24 hours: Temp:  [97.8 F (36.6 C)-99.1 F (37.3 C)] 97.8 F (36.6 C) (12/25 0326) Pulse Rate:  [72-89] 75 (12/25 0326) Resp:  [16-22] 16 (12/25 0326) BP: (137-161)/(64-79) 137/64 (12/25 0326) SpO2:  [96 %-99 %] 97 % (12/25 0326) Last BM Date: 07/14/17(per pt)  Intake/Output from previous day: 12/24 0701 - 12/25 0700 In: 1215 [P.O.:390; I.V.:825] Out: 1950 [Urine:1950] Intake/Output this shift: No intake/output data recorded.  Exam: Awake and alert Appears comfortable Lungs clear CV RRR Abdomen soft, NT/ND  Lab Results:  Recent Labs    07/16/17 0347  WBC 8.8  HGB 10.5*  HCT 32.7*  PLT 383   BMET No results for input(s): NA, K, CL, CO2, GLUCOSE, BUN, CREATININE, CALCIUM in the last 72 hours. PT/INR No results for input(s): LABPROT, INR in the last 72 hours. ABG No results for input(s): PHART, HCO3 in the last 72 hours.  Invalid input(s): PCO2, PO2  Studies/Results: No results found.  Anti-infectives: Anti-infectives (From admission, onward)   Start     Dose/Rate Route Frequency Ordered Stop   07/11/17 2200  ceFAZolin (ANCEF) IVPB 1 g/50 mL premix     1 g 100 mL/hr over 30 Minutes Intravenous Every 8 hours 07/11/17 1828 07/12/17 0531      Assessment/Plan:  Fall PE - On Elliquis after d/w NSR SDH/Concussion - CTH stable, started on Elliquis R frontal scalp hematoma - ice prn R humeral neck fx -non-op management per Dr. Ophelia CharterYates, sling R intertrochanteric femur fracture -ORIF by Dr. Ophelia CharterYates Constipation - continue miralax/colace, enema PRN Chest pain - will check pcxr and ekg  ID - none FEN - IVF,NPO for OR VTE - SCD Dispo - to 4NP, PT/OT     LOS: 8 days    Mashell Sieben A 07/17/2017

## 2017-07-17 NOTE — Progress Notes (Signed)
EKG completed and placed in chart. Notified Dr. Magnus IvanBlackman that it was completed and he can review it in the chart.

## 2017-07-17 NOTE — Progress Notes (Signed)
     Subjective: 6 Days Post-Op Procedure(s) (LRB): RIGHT TROCHANTERIC NAIL (Right) Right knee pain, right hip and right shoulder discomfort. Post right hip ORIF. Patient reports pain as moderate.    Objective:   VITALS:  Temp:  [97.8 F (36.6 C)-99.1 F (37.3 C)] 97.8 F (36.6 C) (12/25 0326) Pulse Rate:  [72-89] 75 (12/25 0326) Resp:  [16-21] 16 (12/25 0326) BP: (137-161)/(64-98) 148/98 (12/25 0831) SpO2:  [96 %-99 %] 97 % (12/25 0831)  Neurologically intact ABD soft Neurovascular intact Sensation intact distally Intact pulses distally Dorsiflexion/Plantar flexion intact Incision: no drainage No cellulitis present Compartment soft Right knee with effusion, active extensor, patella without crepitus, no radiographs   LABS Recent Labs    07/16/17 0347  HGB 10.5*  WBC 8.8  PLT 383   No results for input(s): NA, K, CL, CO2, BUN, CREATININE, GLUCOSE in the last 72 hours. No results for input(s): LABPT, INR in the last 72 hours.   Assessment/Plan: 6 Days Post-Op Procedure(s) (LRB): RIGHT TROCHANTERIC NAIL (Right)  Advance diet Up with therapy Discharge to SNF eventually. Dressing changed right hip. Xray right knee.  May be up to recliner.   Vira BrownsJames [name] 07/17/2017, 9:48 AMPatient ID: Misty [name], female   DOB: 10/11/1934, 82 y.o.   MRN: [id number]

## 2017-07-17 NOTE — Progress Notes (Signed)
Patient ID: Misty [name], female   DOB: 09/08/1934, 82 y.o.   MRN: [id number]   No change in EKG

## 2017-07-18 ENCOUNTER — Encounter (HOSPITAL_COMMUNITY): Payer: Self-pay

## 2017-07-18 LAB — GLUCOSE, CAPILLARY: Glucose-Capillary: 76 mg/dL (ref 65–99)

## 2017-07-18 MED ORDER — TRAMADOL HCL 50 MG PO TABS
50.0000 mg | ORAL_TABLET | Freq: Three times a day (TID) | ORAL | Status: DC
  Administered 2017-07-18 – 2017-07-19 (×3): 50 mg via ORAL
  Filled 2017-07-18 (×3): qty 1

## 2017-07-18 NOTE — Consult Note (Addendum)
WOC Nurse wound consult note Reason for Consult: Consult requested for sacrum/buttocks.  Pt fell at home prior to admission and has generalized erythremia and patchy area of partial thickness skin loss to sacrum/buttocks where a previous blister has ruptured and is peeling; approx 1.2X1.2X.1cm; red and moist.  Appearance is consistent with moisture associated skin damage, she is occasionally incontinent. Pt states the location is painful. Dressing procedure/placement/frequency: Foam dressing has been applied to protect from further injury and absorb moisture. Discussed plan of care with patient and she verblaized understanding. Please re-consult if further assistance is needed.  Thank-you,  Cammie Mcgeeawn Ashely Goosby MSN, RN, CWOCN, Nara VisaWCN-AP, CNS [phone]

## 2017-07-18 NOTE — Progress Notes (Signed)
[facility] Progress Note  7 Days Post-Op  Subjective: CC: pain in right knee and back Patient with pain in right knee and back. Feels like she can't get comfortable in the bed. Tolerating diet. Denies n/v. UOP good. VSS.   Objective: Vital signs in last 24 hours: Temp:  [98 F (36.7 C)-98.9 F (37.2 C)] 98.5 F (36.9 C) (12/26 1247) Pulse Rate:  [72-76] 76 (12/26 1247) Resp:  [13-18] 18 (12/26 1247) BP: (126-149)/(61-68) 132/67 (12/26 1247) SpO2:  [99 %-100 %] 99 % (12/26 1247) Last BM Date: 07/09/17  Intake/Output from previous day: 12/25 0701 - 12/26 0700 In: 4105.3 [P.O.:460; I.V.:3645.3] Out: 1900 [Urine:1900] Intake/Output this shift: Total I/O In: 360 [P.O.:360] Out: -   PE: Gen:  Alert, NAD, pleasant Card:  Regular rate and rhythm, pedal pulses 2+ BL Pulm:  Normal effort, clear to auscultation bilaterally Abd: Soft, non-tender, non-distended, bowel sounds present Ext: RUE in sling, right fingers WWP, sensation and motor intact. Normal ROM in LUE. Right knee mildly edematous, no erythema or warmth.  Skin: warm and dry, no rashes  Psych: A&Ox3   Lab Results:  Recent Labs    07/16/17 0347  WBC 8.8  HGB 10.5*  HCT 32.7*  PLT 383   BMET No results for input(s): NA, K, CL, CO2, GLUCOSE, BUN, CREATININE, CALCIUM in the last 72 hours. PT/INR No results for input(s): LABPROT, INR in the last 72 hours. CMP     Component Value Date/Time   NA 136 07/14/2017 0516   K 4.0 07/14/2017 0516   CL 102 07/14/2017 0516   CO2 28 07/14/2017 0516   GLUCOSE 96 07/14/2017 0516   BUN 13 07/14/2017 0516   CREATININE 0.42 (L) 07/14/2017 0516   CALCIUM 8.1 (L) 07/14/2017 0516   PROT 6.4 (L) 07/09/2017 0212   ALBUMIN 3.5 07/09/2017 0212   AST 22 07/09/2017 0212   ALT 18 07/09/2017 0212   ALKPHOS 73 07/09/2017 0212   BILITOT 0.6 07/09/2017 0212   GFRNONAA >60 07/14/2017 0516   GFRAA >60 07/14/2017 0516   Lipase  No results found for:  LIPASE     Studies/Results: Dg Ankle Complete Right  Result Date: 07/17/2017 CLINICAL DATA:  Right ankle pain after fall 10 days ago. EXAM: RIGHT ANKLE - COMPLETE 3+ VIEW COMPARISON:  None. FINDINGS: There is no evidence of fracture, dislocation, or joint effusion. There is no evidence of arthropathy or other focal bone abnormality. Soft tissues are unremarkable. IMPRESSION: Negative. Electronically Signed   By: Lupita RaiderJames  Green Jr, M.D.   On: 07/17/2017 19:33   Dg Chest Port 1 View  Result Date: 07/17/2017 CLINICAL DATA:  Chest pain EXAM: PORTABLE CHEST 1 VIEW COMPARISON:  07/13/2017 FINDINGS: Heart is borderline in size. Left lower lobe atelectasis or infiltrate. Right base atelectasis has increased since prior study. Suspect small bilateral effusions. IMPRESSION: Continued left lower lobe atelectasis or pneumonia. Right base atelectasis. Small effusions. Electronically Signed   By: Charlett NoseKevin  Dover M.D.   On: 07/17/2017 09:39   Dg Knee Right Port  Result Date: 07/17/2017 CLINICAL DATA:  82 year old female with right knee pain and swelling EXAM: PORTABLE RIGHT KNEE - 1-2 VIEW COMPARISON:  None. FINDINGS: Moderately large suprapatellar knee joint effusion. No evidence of acute fracture or malalignment. Evidence of chondromalacia with joint space narrowing in the medial and patellofemoral compartments. There is some irregularity of the undersurface of the patella likely reflecting subchondral cyst formation. Chondrocalcinosis is present greater in the left than the right compartments. A  speckled sclerotic lesion with ill-defined margins measuring approximately 5.5 x 0.9 cm is present in the distal femoral diaphysis. Atherosclerotic calcifications are present along the superficial femoral artery. IMPRESSION: 1. Moderately large suprapatellar knee joint effusion is favored to be degenerative in etiology. Spontaneous hemarthrosis, or septic joint is also possible in the appropriate clinical setting. 2.  Tricompartmental degenerative changes/chondromalacia most significant in the medial and patellofemoral compartments. 3. Chondrocalcinosis. 4. Probable benign enchondroma in the distal femoral diaphysis. 5. Atherosclerotic vascular calcifications. Electronically Signed   By: Malachy MoanHeath  McCullough M.D.   On: 07/17/2017 10:38    Anti-infectives: Anti-infectives (From admission, onward)   Start     Dose/Rate Route Frequency Ordered Stop   07/11/17 2200  ceFAZolin (ANCEF) IVPB 1 g/50 mL premix     1 g 100 mL/hr over 30 Minutes Intravenous Every 8 hours 07/11/17 1828 07/12/17 0531       Assessment/Plan HTN - continue home med norvasc 5mg , metoprolol 25mg  BID and hydralazine PRN- HR and BP better on metoprolol Osteoporosis Chronic pain - takes tramadol at home, increase to TID  Fall PE- On Elliquis after d/w NSR SDH/Concussion - CTH stable, on Elliquis R frontal scalp hematoma - ice prn R humeral neck fx -non-op management per Dr. Ophelia CharterYates, sling R intertrochanteric femur fracture -ORIF by Dr. Ophelia CharterYates Constipation - continue miralax/colace, enema PRN Chest pain - EKG normal without change ABL anemia- stable  ID - none FEN - IVF, continue diet; d/c dilaudid  VTE - SCD Dispo- SNF, PT/OT    LOS: 9 days    Wells GuilesKelly Rayburn , [facility] 07/18/2017, 2:45 PM Pager: [phone] Trauma Pager: [phone] Mon-Fri 7:00 am-4:30 pm Sat-Sun 7:00 am-11:30 am

## 2017-07-18 NOTE — Social Work (Addendum)
CSW spoke briefly with pt, she stated that her son had looked at offers but she didn't know his phone number for CSW to speak with him.   CSW able to locate son's number [phone]. Will call regarding SNF offers.   11:05am - CSW spoke with pt son, he took the SNF packet and they are looking into SNF options with preference to [place] and surrounding areas. Son will follow up with CSW with any questions and family preference.    Misty HutchingIsabel [name] Misty [name], LCSWA [facility] Clinical Social Work [phone]

## 2017-07-18 NOTE — Progress Notes (Signed)
Physical Therapy Treatment Patient Details Name: Misty [name] MRN: [id number] DOB: 05/10/1935 Today's Date: 07/18/2017    History of Present Illness Misty LovelyGloria J Pooleis a 82 y.o.femalepresenting with AMS. PMH is significant for HTN, DM, HLD, treated hep c, gout, chronic renal insufficiency s/p renal transplant.Pt was found to have UTI.    PT Comments    Patient seen in conjunction with OT therapist for activity progression, tolerated EOB and transfers x2 with 2 person max assist. Patient extremely anxious and self limiting throughout session. At this time, SNF remains appropriate. Will continue to see and progress as tolerated.    Follow Up Recommendations  SNF;Supervision/Assistance - 24 hour     Equipment Recommendations  Wheelchair (measurements PT);Wheelchair cushion (measurements PT);3in1 (PT);Hospital bed    Recommendations for Other Services       Precautions / Restrictions Precautions Precautions: Fall Required Braces or Orthoses: Sling(right arm) Restrictions Weight Bearing Restrictions: Yes RUE Weight Bearing: Non weight bearing RLE Weight Bearing: Weight bearing as tolerated    Mobility  Bed Mobility Overal bed mobility: Needs Assistance Bed Mobility: Supine to Sit Rolling: +2 for physical assistance;Total assist   Supine to sit: +2 for physical assistance;Max assist;HOB elevated     General bed mobility comments: Two person max assist to get pt to move to sitting EOB.  PT needed assist at both legs and trunk. She was able to grab to the left sided bed rail wiht her left hand to help a little.   Transfers Overall transfer level: Needs assistance Equipment used: 2 person hand held assist(wrap around support face to face) Transfers: Sit to/from [employer] HotelsStand;Squat Pivot Transfers Sit to Stand: +2 physical assistance;Max assist;From elevated surface Stand pivot transfers: +2 physical assistance;Max assist       General transfer comment: 2 person face to face max  assist to [facility] ([facility]) patient anxious during process. [facility] to [facility] x[facility] for hygiene and pericare required increased max assist but unable to reach upright posture  Ambulation/Gait             General Gait Details: unable due to patient status and safety concerns    Stairs            Wheelchair Mobility    Modified Rankin (Stroke Patients Only)       Balance Overall balance assessment: Needs assistance Sitting-balance support: Feet supported;Single extremity supported Sitting balance-Leahy Scale: Poor Sitting balance - Comments: Min to mod assist EOB.  When we came to sitting pt reported dizziness that made her feel like she was falling forward.  She may have some central/peripherial vertigo that may need to be assessed at a later date when she can tolerate the treatments.    Standing balance support: Single extremity supported Standing balance-Leahy Scale: Zero Standing balance comment: two person max assist.                             Cognition Arousal/Alertness: Awake/alert Behavior During Therapy: Anxious Overall Cognitive Status: No family/caregiver present to determine baseline cognitive functioning                                 General Comments: Not specifically tested, but she is very anxious.      Exercises Other Exercises Other Exercises: sitting balance at EOB ~4 minutes, patient required hands on assist, attempted to perform static balance without assist x3  trails, patient anxious and unable to maintain    General Comments        Pertinent Vitals/Pain Pain Assessment: Faces Faces Pain Scale: Hurts whole lot Pain Location: right arm and leg Pain Descriptors / Indicators: Grimacing;Guarding Pain Intervention(s): Limited activity within patient's tolerance;Monitored during session;Repositioned    Home Living                      Prior Function            PT Goals (current goals can  now be found in the care plan section) Acute Rehab PT Goals Patient Stated Goal: to not hurt and not fall PT Goal Formulation: Patient unable to participate in goal setting Time For Goal Achievement: 07/26/17 Potential to Achieve Goals: Fair Progress towards PT goals: Progressing toward goals    Frequency    Min 3X/week      PT Plan Current plan remains appropriate    Co-evaluation PT/OT/SLP Co-Evaluation/Treatment: Yes Reason for Co-Treatment: Necessary to address cognition/behavior during functional activity PT goals addressed during session: Mobility/safety with mobility OT goals addressed during session: ADL's and self-care      AM-PAC PT "6 Clicks" Daily Activity  Outcome Measure  Difficulty turning over in bed (including adjusting bedclothes, sheets and blankets)?: Unable Difficulty moving from lying on back to sitting on the side of the bed? : Unable Difficulty sitting down on and standing up from a chair with arms (e.g., wheelchair, bedside commode, etc,.)?: Unable Help needed moving to and from a bed to chair (including a wheelchair)?: Total Help needed walking in hospital room?: Total Help needed climbing 3-5 steps with a railing? : Total 6 Click Score: 6    End of Session Equipment Utilized During Treatment: Gait belt;Oxygen;Other (comment)(R shoulder sling) Activity Tolerance: Patient limited by pain;Other (comment)(anxiety, nausea, dizziness) Patient left: in bed;with call bell/phone within reach;with bed alarm set;with SCD's reapplied Nurse Communication: Mobility status;Weight bearing status PT Visit Diagnosis: Unsteadiness on feet (R26.81);Pain;Difficulty in walking, not elsewhere classified (R26.2) Pain - Right/Left: Right Pain - part of body: Shoulder;Hip     Time: [id number] PT Time Calculation (min) (ACUTE ONLY): 23 min  Charges:  $Therapeutic Activity: 8-22 mins                    G Codes:       Charlotte Crumbevon Marlyn Rabine, PT DPT  Board Certified  Neurologic Specialist [phone]    Fabio AsaDevon J Hang Ammon 07/18/2017, 11:58 AM

## 2017-07-18 NOTE — Progress Notes (Signed)
02 is 100% on 4 L [place]. Titrated 02 down to 3L [place] and 02 sat maintaining 100%. Will continue to monitor.

## 2017-07-18 NOTE — Progress Notes (Signed)
Occupational Therapy Treatment Patient Details Name: Misty [name] MRN: [id number] DOB: 05/27/1935 Today's Date: 07/18/2017    History of present illness Misty LovelyGloria J Pooleis a 82 y.o.femalepresenting with AMS. PMH is significant for HTN, DM, HLD, treated hep c, gout, chronic renal insufficiency s/p renal transplant.Pt was found to have UTI.   OT comments  Pt required max assist +2 for stand pivot [facility], total assist for peri care. Max assist provided for sling management and repositioning. Pt anxious regarding movement/falling and painful with movement. D/c plan remains appropriate. Will continue to follow acutely.   Follow Up Recommendations  SNF    Equipment Recommendations  Other (comment)(TBD at next venue)    Recommendations for Other Services      Precautions / Restrictions Precautions Precautions: Fall Required Braces or Orthoses: Sling(right arm) Restrictions Weight Bearing Restrictions: Yes RUE Weight Bearing: Non weight bearing RLE Weight Bearing: Weight bearing as tolerated       Mobility Bed Mobility Overal bed mobility: Needs Assistance Bed Mobility: Supine to Sit Rolling: +2 for physical assistance;Total assist   Supine to sit: +2 for physical assistance;Max assist;HOB elevated     General bed mobility comments: Two person max assist to get pt to move to sitting EOB.  PT needed assist at both legs and trunk. She was able to grab to the left sided bed rail wiht her left hand to help a little.   Transfers Overall transfer level: Needs assistance Equipment used: 2 person hand held assist(wrap around support face to face) Transfers: Sit to/from [employer]elsStand;Squat Pivot Transfers Sit to Stand: +2 physical assistance;Max assist;From elevated surface Stand pivot transfers: +2 physical assistance;Max assist       General transfer comment: 2 person face to face max assist to [facility] ([facility]) patient anxious during process. Attempt to stand [facility] x2  for hygiene and pericare required increased max assist but unable to reach upright posture    Balance Overall balance assessment: Needs assistance Sitting-balance support: Feet supported;Single extremity supported Sitting balance-Leahy Scale: Poor Sitting balance - Comments: Min to mod assist EOB.  When we came to sitting pt reported dizziness that made her feel like she was falling forward.  She may have some central/peripherial vertigo that may need to be assessed at a later date when she can tolerate the treatments.    Standing balance support: Single extremity supported Standing balance-Leahy Scale: Zero Standing balance comment: two person max assist.                            ADL either performed or assessed with clinical judgement   ADL Overall ADL's : Needs assistance/impaired Eating/Feeding: Minimal assistance;Sitting Eating/Feeding Details (indicate cue type and reason): to drink water             Upper Body Dressing : Maximal assistance;Sitting Upper Body Dressing Details (indicate cue type and reason): to adjust sling Lower Body Dressing: Total assistance Lower Body Dressing Details (indicate cue type and reason): to don socks Toilet Transfer: Maximal assistance;+2 for physical assistance;Stand-pivot;BSC   Toileting- Clothing Manipulation and Hygiene: Total assistance;+2 for physical assistance;Sit to/from stand Toileting - Clothing Manipulation Details (indicate cue type and reason): for peri care     Functional mobility during ADLs: Maximal assistance;+2 for physical assistance(for stand pivot only)       Vision       Perception     Praxis      Cognition Arousal/Alertness: Awake/alert Behavior  During Therapy: Anxious Overall Cognitive Status: No family/caregiver present to determine baseline cognitive functioning                                 General Comments: Not specifically tested, but she is very anxious.         Exercises    Shoulder Instructions       General Comments      Pertinent Vitals/ Pain       Pain Assessment: Faces Faces Pain Scale: Hurts whole lot Pain Location: right arm and leg Pain Descriptors / Indicators: Grimacing;Guarding Pain Intervention(s): Limited activity within patient's tolerance;Monitored during session;Repositioned  Home Living                                          Prior Functioning/Environment              Frequency  Min 2X/week        Progress Toward Goals  OT Goals(current goals can now be found in the care plan section)  Progress towards OT goals: Progressing toward goals  Acute Rehab OT Goals Patient Stated Goal: to not hurt and not fall OT Goal Formulation: With patient  Plan Discharge plan remains appropriate    Co-evaluation    PT/OT/SLP Co-Evaluation/Treatment: Yes Reason for Co-Treatment: To address functional/ADL transfers PT goals addressed during session: Mobility/safety with mobility OT goals addressed during session: ADL's and self-care      AM-PAC PT "6 Clicks" Daily Activity     Outcome Measure   Help from another person eating meals?: A Little Help from another person taking care of personal grooming?: A Lot Help from another person toileting, which includes using toliet, bedpan, or urinal?: A Lot Help from another person bathing (including washing, rinsing, drying)?: A Lot Help from another person to put on and taking off regular upper body clothing?: A Lot Help from another person to put on and taking off regular lower body clothing?: A Lot 6 Click Score: 13    End of Session Equipment Utilized During Treatment: Oxygen  OT Visit Diagnosis: Unsteadiness on feet (R26.81);Pain;Muscle weakness (generalized) (M62.81) Pain - Right/Left: Right Pain - part of body: Shoulder   Activity Tolerance Patient limited by pain   Patient Left in bed;with call bell/phone within reach;with bed alarm  set;with SCD's reapplied   Nurse Communication Mobility status        Time: [id number] OT Time Calculation (min): 27 min  Charges: OT General Charges $OT Visit: 1 Visit OT Treatments $Self Care/Home Management : 8-22 mins  Damarious Holtsclaw A. Brett Albinooffey, M.S., OTR/L Pager: [phone]   Gaye AlkenBailey A Samiyyah Moffa 07/18/2017, 1:22 PM

## 2017-07-19 MED ORDER — APIXABAN 5 MG PO TABS: 10.0000 mg | ORAL_TABLET | Freq: Two times a day (BID) | ORAL | 0 refills | Status: AC

## 2017-07-19 MED ORDER — POLYETHYLENE GLYCOL 3350 17 G PO PACK: 17.0000 g | PACK | Freq: Every day | ORAL | 0 refills | Status: AC

## 2017-07-19 MED ORDER — ONDANSETRON 4 MG PO TBDP: 4.0000 mg | ORAL_TABLET | Freq: Four times a day (QID) | ORAL | 0 refills | Status: AC | PRN

## 2017-07-19 MED ORDER — NYSTATIN 100000 UNIT/ML MT SUSP: 5.0000 mL | Freq: Four times a day (QID) | OROMUCOSAL | 0 refills | Status: AC

## 2017-07-19 MED ORDER — METHOCARBAMOL 500 MG PO TABS: 500.0000 mg | ORAL_TABLET | Freq: Three times a day (TID) | ORAL | Status: AC

## 2017-07-19 MED ORDER — TRAMADOL HCL 50 MG PO TABS: 50.0000 mg | ORAL_TABLET | Freq: Three times a day (TID) | ORAL | 0 refills | Status: AC

## 2017-07-19 MED ORDER — OXYCODONE HCL 5 MG PO TABS: 5.0000 mg | ORAL_TABLET | ORAL | 0 refills | Status: AC | PRN

## 2017-07-19 MED ORDER — APIXABAN 5 MG PO TABS: 5.0000 mg | ORAL_TABLET | Freq: Two times a day (BID) | ORAL | Status: DC

## 2017-07-19 MED ORDER — METOPROLOL TARTRATE 25 MG PO TABS: 25.0000 mg | ORAL_TABLET | Freq: Two times a day (BID) | ORAL | Status: AC

## 2017-07-19 MED ORDER — BISACODYL 10 MG RE SUPP: 10.0000 mg | Freq: Every day | RECTAL | Status: DC | PRN

## 2017-07-19 MED ORDER — GABAPENTIN 100 MG PO CAPS: 100.0000 mg | ORAL_CAPSULE | Freq: Two times a day (BID) | ORAL | Status: AC

## 2017-07-19 NOTE — Clinical Social Work Note (Signed)
Clinical Social Worker facilitated patient discharge including contacting patient family and facility to confirm patient discharge plans.  Clinical information faxed to facility and family agreeable with plan.  CS[facility]  RN to call report prior to discharge.  Clinical Social Worker will sign off for now as social work intervention is no longer needed. Please consult us again if new need arises.  Macario GoldsJesse Amirr Achord, Kentucky[place]CSW [phone]

## 2017-07-19 NOTE — Progress Notes (Signed)
REPORT GIV[facility] TO [facility].

## 2017-07-19 NOTE — Progress Notes (Signed)
[facility] Progress Note  8 Days Post-Op  Subjective: CC: nausea Patient with some mild nausea this AM, has not had a BM in several days. Feels sore all over and like she can't get situated in the bed. Wanting to get home to the small children that she has adopted UOP good. VSS.   Objective: Vital signs in last 24 hours: Temp:  [98.3 F (36.8 C)-98.9 F (37.2 C)] 98.3 F (36.8 C) (12/27 0831) Pulse Rate:  [76-79] 79 (12/27 0831) Resp:  [14-20] 20 (12/27 0831) BP: (123-157)/(60-77) 145/69 (12/27 0831) SpO2:  [97 %-100 %] 97 % (12/27 0831) Last BM Date: 07/09/17  Intake/Output from previous day: 12/26 0701 - 12/27 0700 In: 1912.5 [P.O.:600; I.V.:1312.5] Out: 1700 [Urine:1700] Intake/Output this shift: No intake/output data recorded.  PE: Gen:  Alert, NAD, pleasant Card:  Regular rate and rhythm, pedal pulses 2+ BL Pulm:  Normal effort, clear to auscultation bilaterally Abd: Soft, non-tender, non-distended, bowel sounds present Ext: RUE in sling, right fingers WWP, sensation and motor intact. Normal ROM in LUE. Right hip dressing c/d/i. Right knee mildly edematous, no erythema or warmth.  Skin: warm and dry, no rashes  Psych: A&Ox3   Lab Results:  No results for input(s): WBC, HGB, HCT, PLT in the last 72 hours. BMET No results for input(s): NA, K, CL, CO2, GLUCOSE, BUN, CREATININE, CALCIUM in the last 72 hours. PT/INR No results for input(s): LABPROT, INR in the last 72 hours. CMP     Component Value Date/Time   NA 136 07/14/2017 0516   K 4.0 07/14/2017 0516   CL 102 07/14/2017 0516   CO2 28 07/14/2017 0516   GLUCOSE 96 07/14/2017 0516   BUN 13 07/14/2017 0516   CREATININE 0.42 (L) 07/14/2017 0516   CALCIUM 8.1 (L) 07/14/2017 0516   PROT 6.4 (L) 07/09/2017 0212   ALBUMIN 3.5 07/09/2017 0212   AST 22 07/09/2017 0212   ALT 18 07/09/2017 0212   ALKPHOS 73 07/09/2017 0212   BILITOT 0.6 07/09/2017 0212   GFRNONAA >60 07/14/2017 0516   GFRAA >60  07/14/2017 0516   Lipase  No results found for: LIPASE   Studies/Results: Dg Ankle Complete Right  Result Date: 07/17/2017 CLINICAL DATA:  Right ankle pain after fall 10 days ago. EXAM: RIGHT ANKLE - COMPLETE 3+ VIEW COMPARISON:  None. FINDINGS: There is no evidence of fracture, dislocation, or joint effusion. There is no evidence of arthropathy or other focal bone abnormality. Soft tissues are unremarkable. IMPRESSION: Negative. Electronically Signed   By: Lupita RaiderJames  Green Jr, M.D.   On: 07/17/2017 19:33   Dg Chest Port 1 View  Result Date: 07/17/2017 CLINICAL DATA:  Chest pain EXAM: PORTABLE CHEST 1 VIEW COMPARISON:  07/13/2017 FINDINGS: Heart is borderline in size. Left lower lobe atelectasis or infiltrate. Right base atelectasis has increased since prior study. Suspect small bilateral effusions. IMPRESSION: Continued left lower lobe atelectasis or pneumonia. Right base atelectasis. Small effusions. Electronically Signed   By: Charlett NoseKevin  Dover M.D.   On: 07/17/2017 09:39   Dg Knee Right Port  Result Date: 07/17/2017 CLINICAL DATA:  82 year old female with right knee pain and swelling EXAM: PORTABLE RIGHT KNEE - 1-2 VIEW COMPARISON:  None. FINDINGS: Moderately large suprapatellar knee joint effusion. No evidence of acute fracture or malalignment. Evidence of chondromalacia with joint space narrowing in the medial and patellofemoral compartments. There is some irregularity of the undersurface of the patella likely reflecting subchondral cyst formation. Chondrocalcinosis is present greater in the left than  the right compartments. A speckled sclerotic lesion with ill-defined margins measuring approximately 5.5 x 0.9 cm is present in the distal femoral diaphysis. Atherosclerotic calcifications are present along the superficial femoral artery. IMPRESSION: 1. Moderately large suprapatellar knee joint effusion is favored to be degenerative in etiology. Spontaneous hemarthrosis, or septic joint is also  possible in the appropriate clinical setting. 2. Tricompartmental degenerative changes/chondromalacia most significant in the medial and patellofemoral compartments. 3. Chondrocalcinosis. 4. Probable benign enchondroma in the distal femoral diaphysis. 5. Atherosclerotic vascular calcifications. Electronically Signed   By: Malachy MoanHeath  McCullough M.D.   On: 07/17/2017 10:38    Anti-infectives: Anti-infectives (From admission, onward)   Start     Dose/Rate Route Frequency Ordered Stop   07/11/17 2200  ceFAZolin (ANCEF) IVPB 1 g/50 mL premix     1 g 100 mL/hr over 30 Minutes Intravenous Every 8 hours 07/11/17 1828 07/12/17 0531       Assessment/Plan HTN - continue home med norvasc 5mg , metoprolol 25mg  BID and hydralazine PRN- HR and BP better on metoprolol Osteoporosis Chronic pain - takes tramadol at home, increase to TID  Fall PE- On Elliquis after d/w NSR, continue to wean O2 SDH/Concussion - CTH stable, on Elliquis R frontal scalp hematoma - ice prn R humeral neck fx -non-op management per Dr. Ophelia CharterYates, sling R intertrochanteric femur fracture -ORIF by Dr. Ophelia CharterYates Constipation - continue miralax/colace, enema PRN Chest pain - EKG normal without change ABL anemia- stable  ID - none FEN - IVF,continue diet VTE - SCD  Dispo-SNF, PT/OT    LOS: 10 days    Wells GuilesKelly Rayburn , [facility] 07/19/2017, 9:28 AM Pager: [phone] Trauma Pager: [phone] Mon-Fri 7:00 am-4:30 pm Sat-Sun 7:00 am-11:30 am

## 2017-07-19 NOTE — Care Management Note (Signed)
Case Management Note  Patient Details  Name: Misty [name] MRN: [id number] Date of Birth: 11/06/1934  Subjective/Objective:   Pt admitted on 07/09/17 s/p ground level fall with acute RT SDH without midline shift, small amount of intraventricular blood, RT frontal scalp hematoma, RT humeral neck fx, and RT intertrochanteric RT femur fx.  PTA, pt resided at home with her son.                   Action/Plan: Ortho repairs planned for 07/11/17 afternoon.  PT/OT to follow post op.  Will follow for discharge planning as pt progresses.    Expected Discharge Date:  07/19/17               Expected Discharge Plan:  Skilled Nursing Facility  In-House Referral:  Clinical Social Work  Discharge planning Services  CM Consult  Post Acute Care Choice:    Choice offered to:     DME Arranged:    DME Agency:     HH Arranged:    HH Agency:     Status of Service:  Completed, signed off  If discussed at [employer] Meetings, dates discussed:    Additional Comments:  07/16/17 J. Celeste Candelas, RN, BSN PT/OT recommending SNF for rehab; CSW consulted to facilitate dc to SNF upon medical stability.    07/19/17 J. Darria Corvera, RN, BSN Pt medically stable for discharge to SNF today, per CSW arrangements.    Quintella BatonJulie W. Sueann Brownley, RN, BSN  Trauma/Neuro ICU Case Manager [phone]

## 2017-07-19 NOTE — Clinical Social Work Placement (Signed)
   CLINICAL SOCIAL WORK PLACEMENT  NOTE  Date:  07/19/2017  Patient Details  Name: Misty [name] MRN: [id number] Date of Birth: 02/04/1935  Clinical Social Work is seeking post-discharge placement for this patient at the Skilled  Nursing Facility level of care (*CSW will initial, date and re-position this form in  chart as items are completed):  Yes   Patient/family provided with [place] Clinical Social Work Department's list of facilities offering this level of care within the geographic area requested by the patient (or if unable, by the patient's family).  Yes   Patient/family informed of their freedom to choose among providers that offer the needed level of care, that participate in Medicare, Medicaid or managed care program needed by the patient, have an available bed and are willing to accept the patient.  Yes   Patient/family informed of [facility]'s ownership interest in [facility] and [facility], as well as of the fact that they are under no obligation to receive care at these facilities.  PASRR submitted to EDS on       PASRR number received on       Existing PASRR number confirmed on 07/11/17     FL2 transmitted to all facilities in geographic area requested by pt/family on       FL2 transmitted to all facilities within larger geographic area on 07/11/17     Patient informed that his/her managed care company has contracts with or will negotiate with certain facilities, including the following:        Yes   Patient/family informed of bed offers received.  [facility]     Physician recommends and patient chooses bed at      [facility].  Patient to be transferred to facility by [facility]     Patient family notified on 07/19/17 of transfer.  Name of family member notified:  Son - Mr. Curtis over the phone     PHYSICIAN Please prepare priority discharge summary,  including medications     Additional Comment:   Macario GoldsJesse Gavinn Collard, LCSW [phone]

## 2017-07-19 NOTE — Progress Notes (Signed)
Transport here to pick up patient.  Gave brief report.  Pt's pain is tolerable.  No s/s of any acute distress.

## 2017-07-19 NOTE — Progress Notes (Signed)
[facility] to [facility].  No answer.  Packet available with AVS summary available.

## 2017-07-20 ENCOUNTER — Telehealth (INDEPENDENT_AMBULATORY_CARE_PROVIDER_SITE_OTHER): Payer: Self-pay | Admitting: Orthopaedic Surgery

## 2017-07-20 NOTE — Telephone Encounter (Signed)
I called and advised Misty [name] until seen in the office. I did advise patient does not have follow up appt scheduled. He states that she just got there yesterday and someone would call to schedule.

## 2017-07-20 NOTE — Telephone Encounter (Signed)
[facility] called wanting to speak with you about the patients weight bearing status. CB # [phone]

## 2017-08-03 ENCOUNTER — Encounter (INDEPENDENT_AMBULATORY_CARE_PROVIDER_SITE_OTHER): Payer: Self-pay | Admitting: Orthopaedic Surgery

## 2017-08-03 ENCOUNTER — Ambulatory Visit (INDEPENDENT_AMBULATORY_CARE_PROVIDER_SITE_OTHER): Payer: 59

## 2017-08-03 ENCOUNTER — Ambulatory Visit (INDEPENDENT_AMBULATORY_CARE_PROVIDER_SITE_OTHER): Payer: 59 | Admitting: Orthopaedic Surgery

## 2017-08-03 ENCOUNTER — Ambulatory Visit (INDEPENDENT_AMBULATORY_CARE_PROVIDER_SITE_OTHER): Payer: Medicare Other

## 2017-08-03 DIAGNOSIS — M25551 Pain in right hip: Secondary | ICD-10-CM

## 2017-08-03 DIAGNOSIS — M25511 Pain in right shoulder: Secondary | ICD-10-CM | POA: Diagnosis not present

## 2017-08-03 DIAGNOSIS — S72141D Displaced intertrochanteric fracture of right femur, subsequent encounter for closed fracture with routine healing: Secondary | ICD-10-CM

## 2017-08-03 NOTE — Progress Notes (Signed)
   Post-Op Visit Note   Patient: Misty [name]           Date of Birth: 09/26/1934           MRN: [id number] Visit Date: 08/03/2017 PCP: Patient, No Pcp Per   Assessment & Plan: Follow-up right short trochanteric nail for minimally displaced intertrochanteric fracture.  Postop x-rays taken today after staples were harvested show good position and alignment.  She had angulated and displaced right proximal humerus fracture.  This is being treated conservatively.  She is in skilled facility.  She can weight-bear as tolerated with the right lower extremity with assistance.  No therapy right shoulder or arm until I see her back in 4 weeks.  Chief Complaint:  Chief Complaint  Patient presents with  . Right Shoulder - Fracture  . Right Hip - Routine Post Op   Visit Diagnoses:  1. Acute pain of right shoulder   2. Pain in right hip     Plan: Return in 4 weeks 2 view x-rays right proximal humerus on return.  AP and frog-leg right hip.  Follow-Up Instructions: No Follow-up on file.   Orders:  Orders Placed This Encounter  Procedures  . XR HIP UNILAT W OR W/O PELVIS 2-3 VIEWS RIGHT  . XR Shoulder Right   No orders of the defined types were placed in this encounter.   Imaging: No results found.  PMFS History: Patient Active Problem List   Diagnosis Date Noted  . Malnutrition of moderate degree 07/13/2017  . Fall from standing 07/09/2017   Past Medical History:  Diagnosis Date  . Bladder infection   . Complication of anesthesia    DIFFICULTY WAKING   . Hypertension   . Pneumonia     No family history on file.  Past Surgical History:  Procedure Laterality Date  . ABDOMINAL HYSTERECTOMY    . APPENDECTOMY    . BREAST BIOPSY    . INTRAMEDULLARY (IM) NAIL INTERTROCHANTERIC Right 07/11/2017   Procedure: RIGHT TROCHANTERIC NAIL;  Surgeon: Eldred MangesYates, Mark C, MD;  [facility];  Service: Orthopedics;  Laterality: Right;  . TONSILLECTOMY     Social History   Occupational History    . Not on file  Tobacco Use  . Smoking status: Never Smoker  . Smokeless tobacco: Never Used  Substance and Sexual Activity  . Alcohol use: No    Frequency: Never  . Drug use: No  . Sexual activity: Not on file

## 2017-09-06 ENCOUNTER — Ambulatory Visit (INDEPENDENT_AMBULATORY_CARE_PROVIDER_SITE_OTHER): Payer: Medicare Other | Admitting: Surgery

## 2017-09-11 ENCOUNTER — Ambulatory Visit (INDEPENDENT_AMBULATORY_CARE_PROVIDER_SITE_OTHER): Payer: Medicare Other

## 2017-09-11 ENCOUNTER — Encounter (INDEPENDENT_AMBULATORY_CARE_PROVIDER_SITE_OTHER): Payer: Self-pay | Admitting: Orthopaedic Surgery

## 2017-09-11 ENCOUNTER — Ambulatory Visit (INDEPENDENT_AMBULATORY_CARE_PROVIDER_SITE_OTHER): Payer: Medicare Other | Admitting: Orthopaedic Surgery

## 2017-09-11 DIAGNOSIS — Z09 Encounter for follow-up examination after completed treatment for conditions other than malignant neoplasm: Secondary | ICD-10-CM

## 2017-09-11 NOTE — Progress Notes (Signed)
   Post-Op Visit Note   Patient: Misty [name]           Date of Birth: 04/21/1935           MRN: [id number] Visit Date: 09/11/2017 PCP: Patient, No Pcp Per   Assessment & Plan: Follow-up short troch nail on the right and sling treatment for proximal humerus fracture.  Shoulder proximal distal fragments moved together.  She can use her arm as she is able.  She is had multiple history of falls,  history of multiple fractures.  She can weight-bear as tolerated on her hip.  Office follow-up as needed.  Chief Complaint:  Chief Complaint  Patient presents with  . Right Hip - Fracture, Follow-up   Visit Diagnoses:  1. Fracture follow-up     Plan: She can work on shoulder range of motion of her right shoulder on her own.  I am concerned formal physical therapy may cause further fractures.  Right hip fracture is completely healed.  She can return on an as-needed basis.  Weightbearing as tolerated on her hip.  Follow-Up Instructions: No Follow-up on file.   Orders:  Orders Placed This Encounter  Procedures  . XR Humerus Right  . XR HIP UNILAT W OR W/O PELVIS 2-3 VIEWS RIGHT   No orders of the defined types were placed in this encounter.   Imaging: No results found.  PMFS History: Patient Active Problem List   Diagnosis Date Noted  . Malnutrition of moderate degree 07/13/2017  . Fall from standing 07/09/2017   Past Medical History:  Diagnosis Date  . Bladder infection   . Complication of anesthesia    DIFFICULTY WAKING   . Hypertension   . Pneumonia     History reviewed. No pertinent family history.  Past Surgical History:  Procedure Laterality Date  . ABDOMINAL HYSTERECTOMY    . APPENDECTOMY    . BREAST BIOPSY    . INTRAMEDULLARY (IM) NAIL INTERTROCHANTERIC Right 07/11/2017   Procedure: RIGHT TROCHANTERIC NAIL;  Surgeon: Eldred MangesYates, Gean Laursen C, MD;  Location[facility];  Service: Orthopedics;  Laterality: Right;  . TONSILLECTOMY     Social History   Occupational History  . Not  on file  Tobacco Use  . Smoking status: Never Smoker  . Smokeless tobacco: Never Used  Substance and Sexual Activity  . Alcohol use: No    Frequency: Never  . Drug use: No  . Sexual activity: Not on file

## 2017-09-14 ENCOUNTER — Telehealth (INDEPENDENT_AMBULATORY_CARE_PROVIDER_SITE_OTHER): Payer: Self-pay | Admitting: Orthopaedic Surgery

## 2017-09-14 NOTE — Telephone Encounter (Signed)
Please see below. OK for copies of office notes.

## 2017-09-14 NOTE — Telephone Encounter (Signed)
Please advise 

## 2017-09-14 NOTE — Telephone Encounter (Signed)
OK to fix copy of office notes and her POA can pick up. Thank

## 2017-09-14 NOTE — Telephone Encounter (Signed)
Patients POA, Pryor OchoaMelissa Lester is needing a letter stating patients physical condition for social security purposes. Patient currently has custody of 2 kids, ages 365 & 409. She is in rehab due to a fall where she had multiple fractures as well as a major concussion, where Dr. Ophelia CharterYates has cared for her and completed surgery. Since then patients dementia has gotten worse. Also, she is currently in a rehab center and has not cooperated with therapy and has gotten aggressive with some employees. Melissa needs a letter just overall explaining that due to her physical condition of her not being able to do much on her own that she cannot care for the kids. Call Melissa once letter is complete and she will pick up. # [phone]

## 2017-09-19 NOTE — Telephone Encounter (Signed)
I called and spoke with Melissa. She states this is a form that social security needs because of them having to change some of the patient's information. She states the patient is going to a rest home permanently and has custody of children and this info needs to be corrected. She will email me the form to show Dr. Ophelia CharterYates to see if he is able to fill out. I did advise Melissa that I am working in a different office today and will not be able to get the form to Dr. Ophelia CharterYates until tomorrow for review. She expressed understanding.

## 2017-09-19 NOTE — Telephone Encounter (Signed)
Copy of records has been taken care of. Misty OchoaMelissa [name] now wants to ask you about a form that needs to be completed for pts social security. Please call to discuss to make sure if the form is something you would complete. Callback [phone]

## 2018-02-21 DEATH — deceased

## 2018-12-15 IMAGING — CR DG CHEST 1V PORT
1 series · 1 of 1 positions shown · non-contrast
Comparison: 07/09/2017.

CLINICAL DATA: Shortness of breath.

EXAM:
PORTABLE CHEST 1 VIEW

[AP]
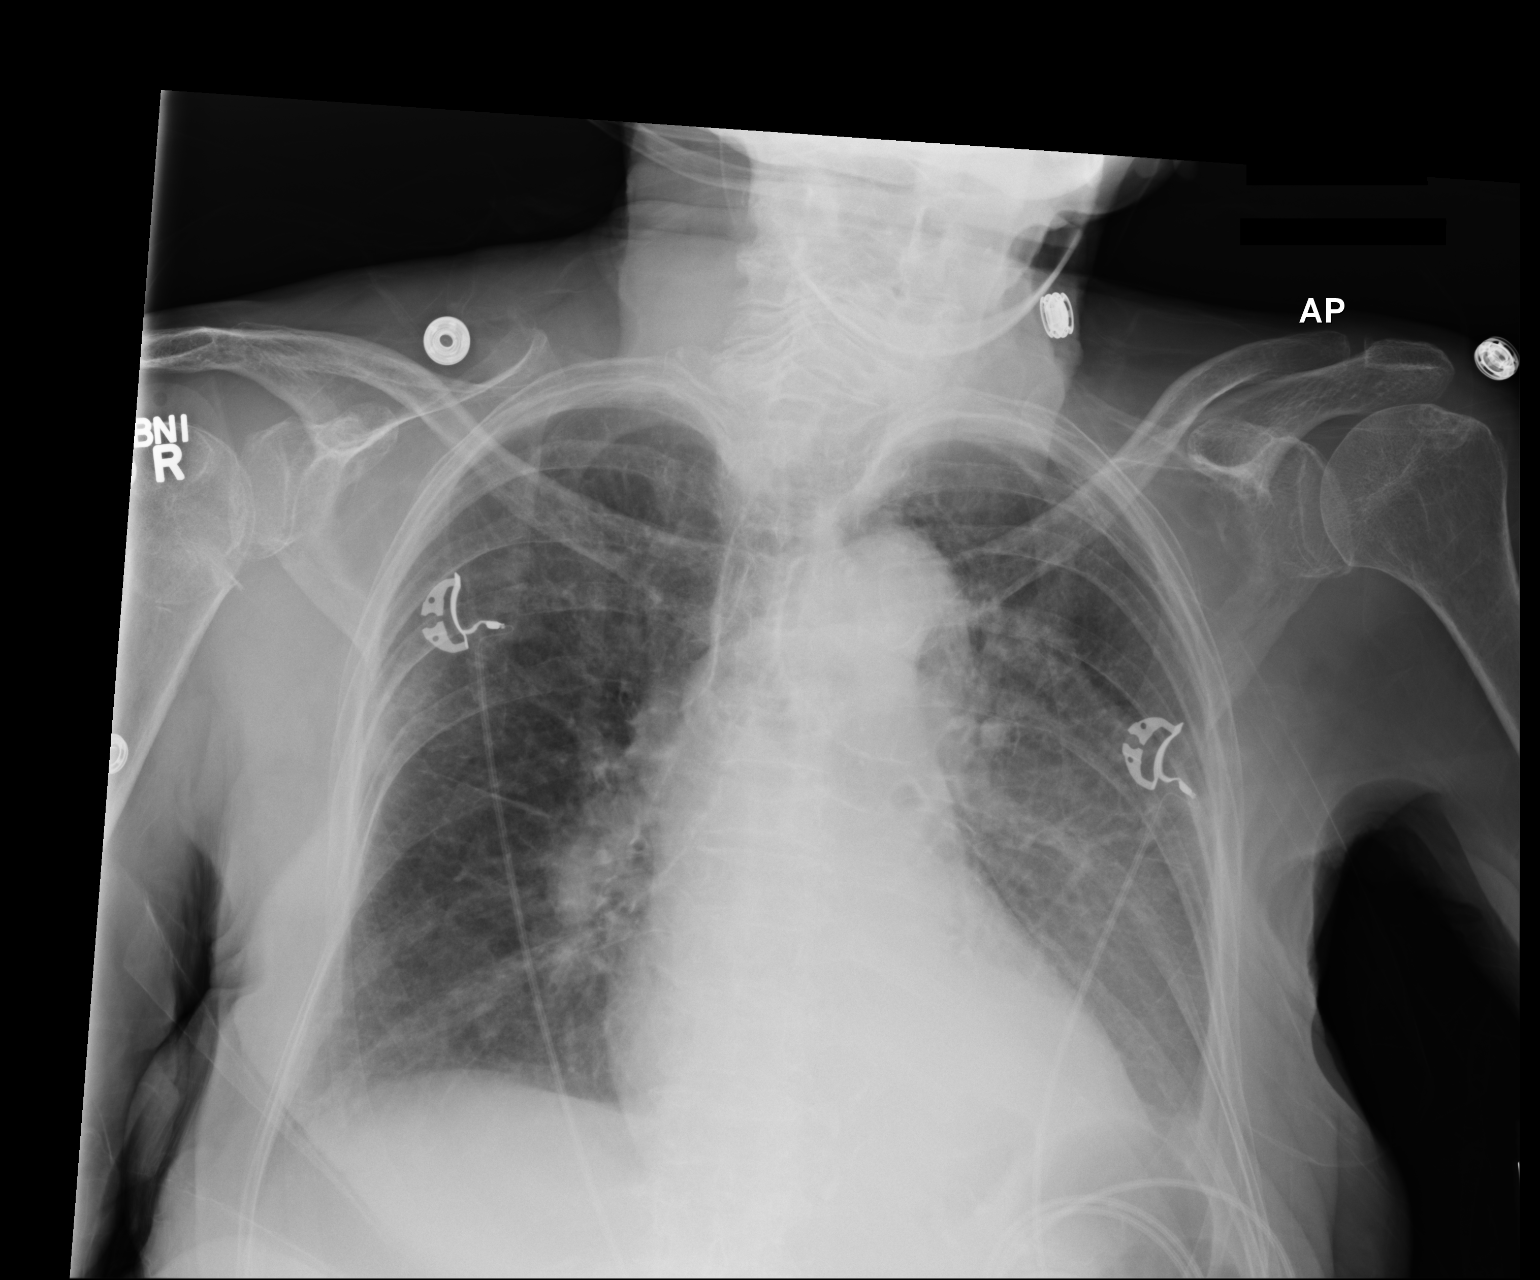

[1 of 1 positions shown; findings below may reference images not displayed]

FINDINGS: LEFT lower lobe atelectasis has developed, dense retrocardiac
region. Superimposed consolidation not excluded. No pneumothorax.
Unchanged cardiomediastinal silhouette. Unchanged RIGHT humerus
fracture.
IMPRESSION: Dense LEFT lower lobe atelectasis has developed since priors.
Superimposed consolidation not excluded on this portable semi erect
radiograph. No pneumothorax.

## 2018-12-15 IMAGING — CT CT HEAD W/O CM
3 of 4 series · 15 of 47 positions shown, 18 images · non-contrast
Comparison: 07/09/2017.

CLINICAL DATA: Continued surveillance closed head injury.

EXAM:
CT HEAD WITHOUT CONTRAST
TECHNIQUE: Contiguous axial images were obtained from the base of the skull
through the vertex without intravenous contrast.

[Series 4: head 2.0 h70h · axial · 0.41mm/px · z∈[-87,+41]mm · 9 of 80 slices shown, 12 images]
[im 8/80  brain]
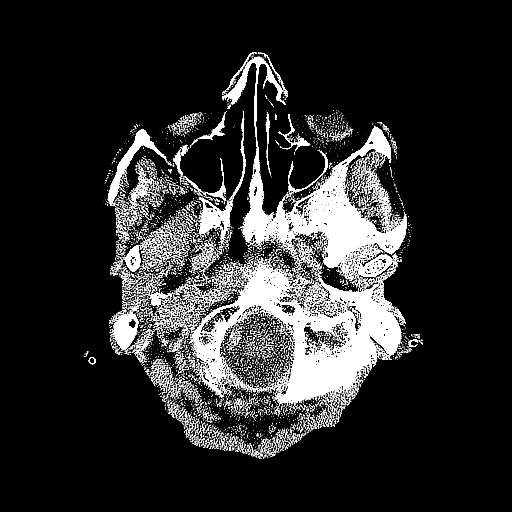
[im 8/80  bone]
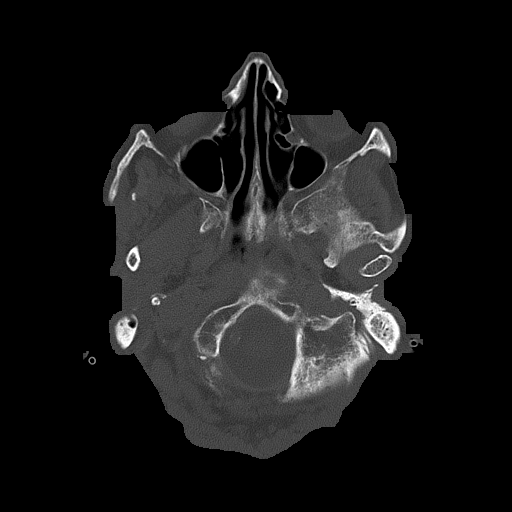
[im 16/80  brain]
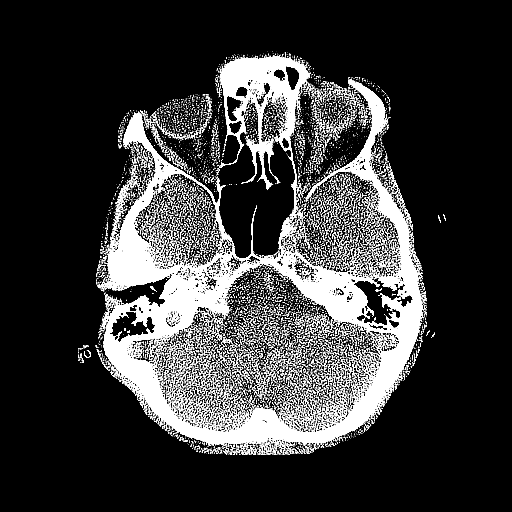
[im 24/80  brain]
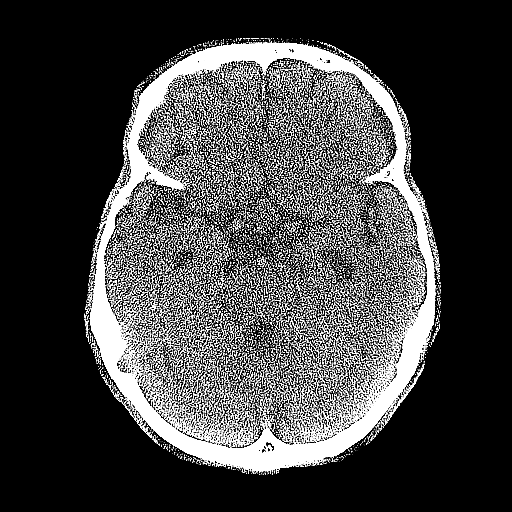
[im 32/80  brain]
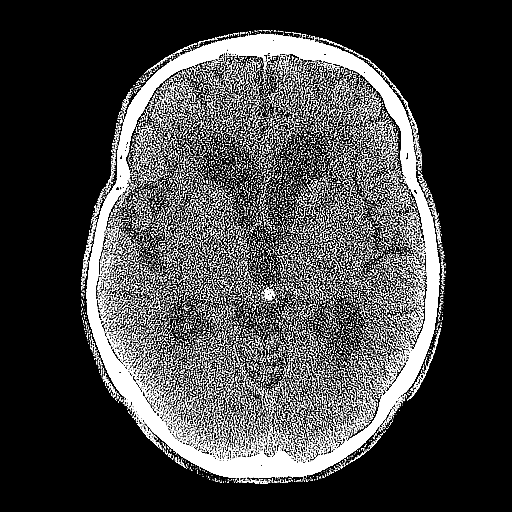
[im 40/80  brain]
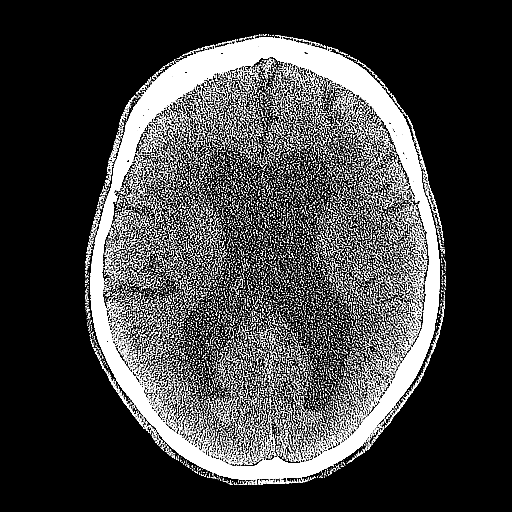
[im 40/80  bone]
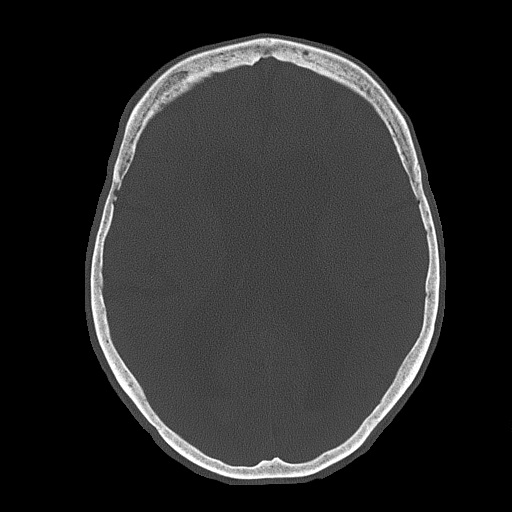
[im 48/80  brain]
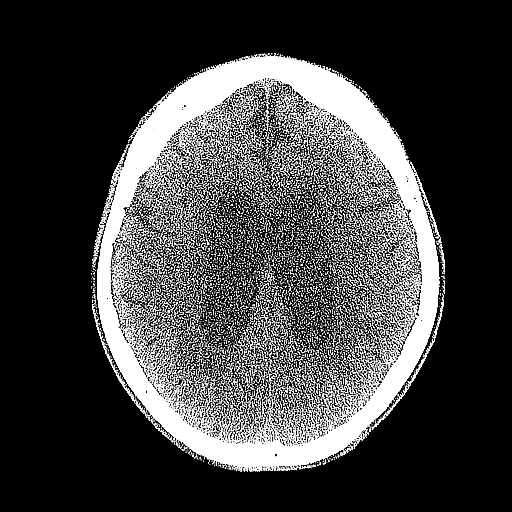
[im 56/80  brain]
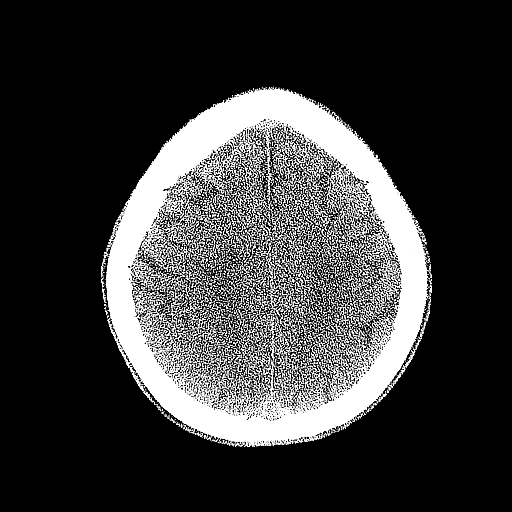
[im 64/80  brain]
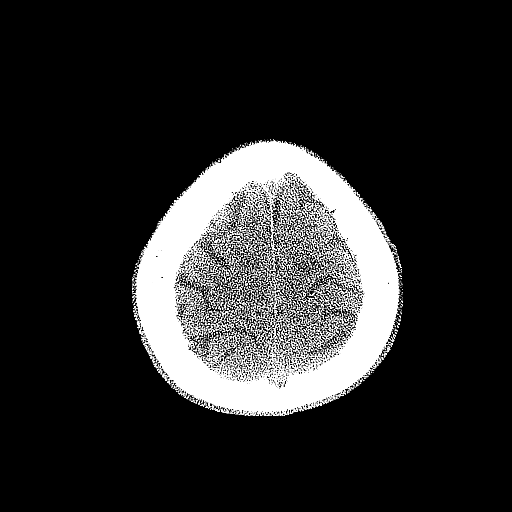
[im 72/80  brain]
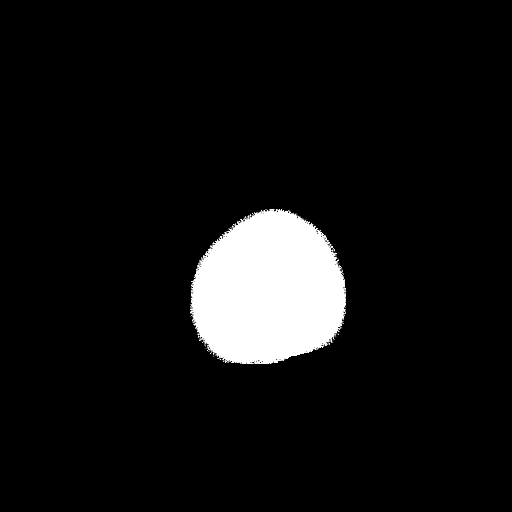
[im 72/80  bone]
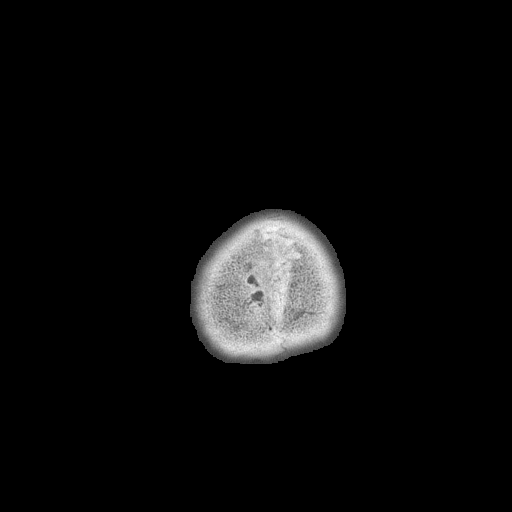

[Series 5: head 3.0 mpr cor · coronal · 0.33mm/px · 3 of 67 slices shown]
[im 23/67  brain]
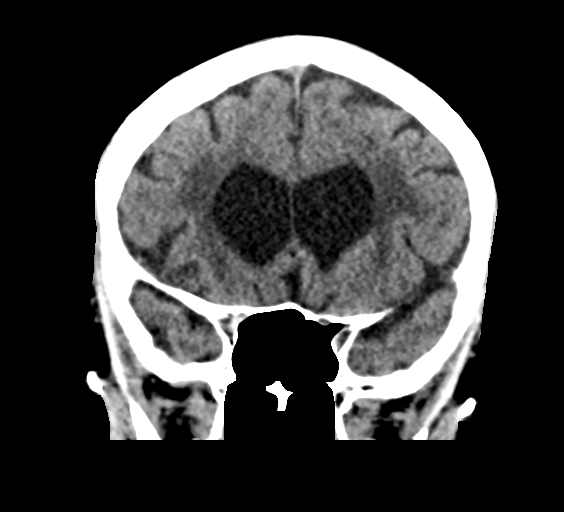
[im 30/67  brain]
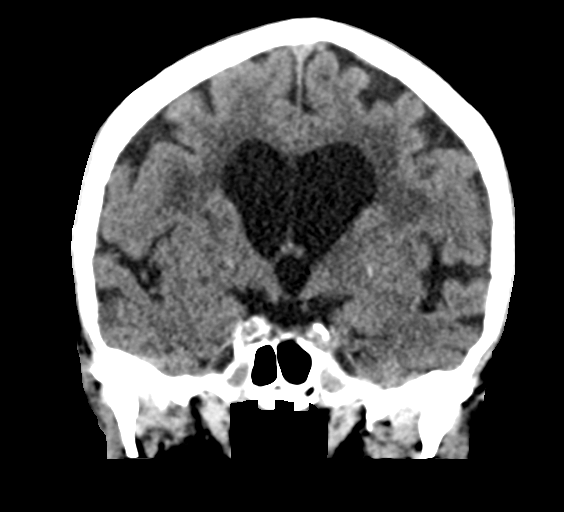
[im 37/67  brain]
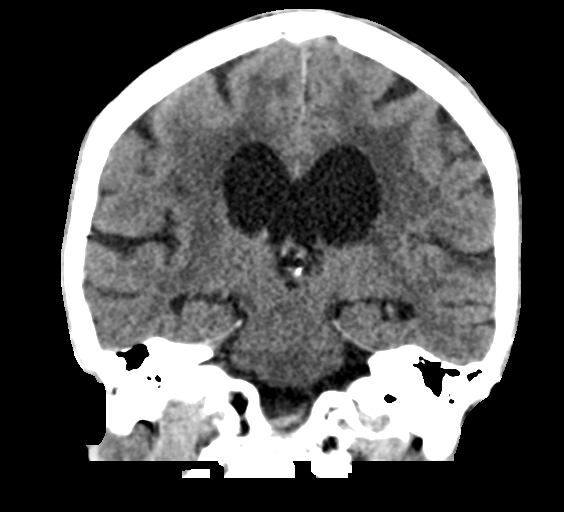

[Series 6: head 3.0 mpr sag · sagittal · 0.33mm/px · 3 of 55 slices shown]
[im 19/55  brain]
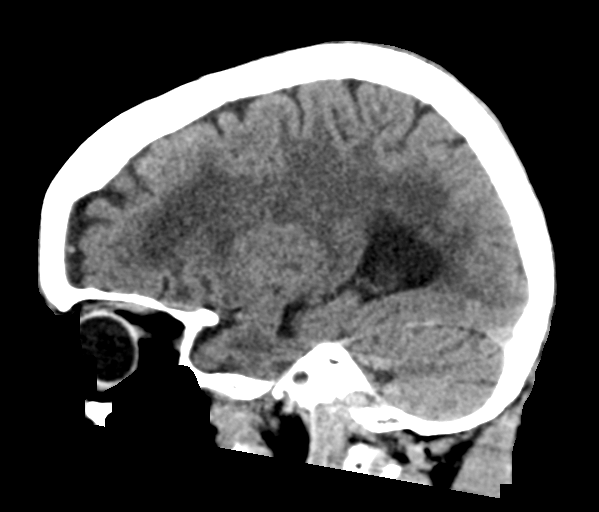
[im 28/55  brain]
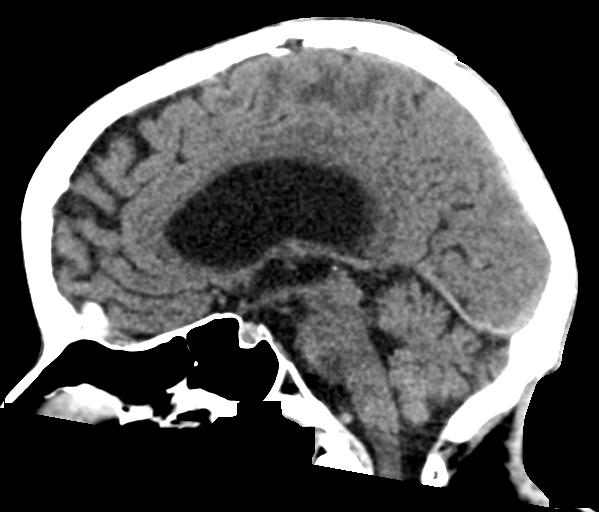
[im 37/55  brain]
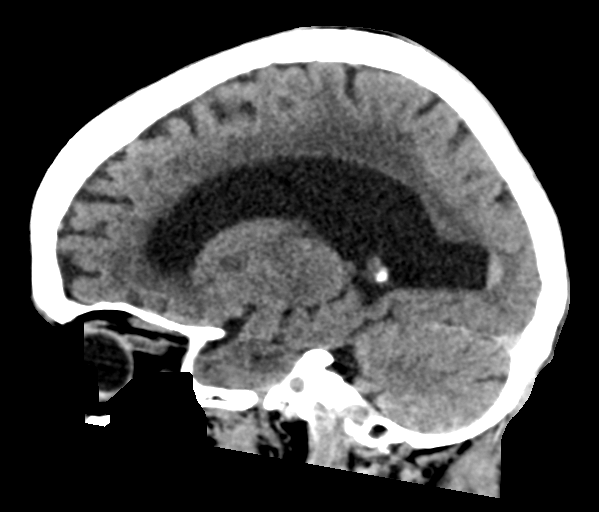

[15 of 47 positions shown; findings below may reference images not displayed]

FINDINGS: Brain: The previously described acute RIGHT subdural has resolved, 2
mm previously. No new extra-axial collections.

Layering intraventricular blood appears mildly increased from
priors, but not dramatically so. There is more hemorrhage in the
dependent RIGHT and LEFT occipital horns.

Ventriculomegaly, without proportionate cortical atrophy, raising
the question of normal pressure hydrocephalus. No parenchymal
hemorrhage. Hypoattenuation of white matter, uncertain significance.

Vascular: Calcification of the cavernous internal carotid arteries
consistent with cerebrovascular atherosclerotic disease. No signs of
intracranial large vessel occlusion.

Skull: No skull fracture.

Sinuses/Orbits: Clear sinuses.

Other: None.
IMPRESSION: RIGHT subdural hematoma has resolved.

Layering intraventricular blood appears modestly increased from
priors, greater on the RIGHT. Continued surveillance warranted.

Ventriculomegaly without proportion cortical atrophy, query normal
pressure hydrocephalus.
